# Patient Record
Sex: Male | Born: 1965 | Race: Black or African American | Hispanic: No | Marital: Single | Smoking: Current every day smoker
Health system: Southern US, Community
[De-identification: ages and names within clinical notes are randomized; demographics above are authoritative.]

## PROBLEM LIST (undated history)

## (undated) DIAGNOSIS — K859 Acute pancreatitis without necrosis or infection, unspecified: Secondary | ICD-10-CM

## (undated) HISTORY — PX: WRIST SURGERY: SHX841

---

## 1999-09-28 ENCOUNTER — Encounter: Payer: Self-pay | Admitting: Emergency Medicine

## 1999-09-28 ENCOUNTER — Emergency Department (HOSPITAL_COMMUNITY): Admission: EM | Admit: 1999-09-28 | Discharge: 1999-09-28 | Payer: Self-pay | Admitting: Emergency Medicine

## 2002-11-19 ENCOUNTER — Emergency Department (HOSPITAL_COMMUNITY): Admission: EM | Admit: 2002-11-19 | Discharge: 2002-11-20 | Payer: Self-pay | Admitting: Emergency Medicine

## 2002-11-19 ENCOUNTER — Encounter: Payer: Self-pay | Admitting: Emergency Medicine

## 2012-05-13 ENCOUNTER — Encounter (HOSPITAL_COMMUNITY): Payer: Self-pay | Admitting: *Deleted

## 2012-05-13 ENCOUNTER — Emergency Department (HOSPITAL_COMMUNITY): Payer: Self-pay

## 2012-05-13 ENCOUNTER — Inpatient Hospital Stay (HOSPITAL_COMMUNITY)
Admission: EM | Admit: 2012-05-13 | Discharge: 2012-05-18 | DRG: 440 | Disposition: A | Discharge status: Home / Self Care | Payer: MEDICAID | Attending: Family Medicine | Admitting: Family Medicine

## 2012-05-13 ENCOUNTER — Emergency Department (INDEPENDENT_AMBULATORY_CARE_PROVIDER_SITE_OTHER)
Admission: EM | Admit: 2012-05-13 | Discharge: 2012-05-13 | Disposition: A | Discharge status: Nursing Home (Includes ICF) | Payer: Self-pay | Source: Home / Self Care | Attending: Emergency Medicine | Admitting: Emergency Medicine

## 2012-05-13 DIAGNOSIS — Z833 Family history of diabetes mellitus: Secondary | ICD-10-CM

## 2012-05-13 DIAGNOSIS — G8929 Other chronic pain: Secondary | ICD-10-CM

## 2012-05-13 DIAGNOSIS — K8020 Calculus of gallbladder without cholecystitis without obstruction: Secondary | ICD-10-CM | POA: Diagnosis present

## 2012-05-13 DIAGNOSIS — R7309 Other abnormal glucose: Secondary | ICD-10-CM | POA: Diagnosis present

## 2012-05-13 DIAGNOSIS — R935 Abnormal findings on diagnostic imaging of other abdominal regions, including retroperitoneum: Secondary | ICD-10-CM | POA: Diagnosis present

## 2012-05-13 DIAGNOSIS — K859 Acute pancreatitis without necrosis or infection, unspecified: Principal | ICD-10-CM | POA: Diagnosis present

## 2012-05-13 DIAGNOSIS — Z720 Tobacco use: Secondary | ICD-10-CM | POA: Diagnosis present

## 2012-05-13 DIAGNOSIS — F17200 Nicotine dependence, unspecified, uncomplicated: Secondary | ICD-10-CM | POA: Diagnosis present

## 2012-05-13 DIAGNOSIS — R509 Fever, unspecified: Secondary | ICD-10-CM | POA: Diagnosis not present

## 2012-05-13 DIAGNOSIS — R030 Elevated blood-pressure reading, without diagnosis of hypertension: Secondary | ICD-10-CM | POA: Diagnosis present

## 2012-05-13 LAB — CBC
HCT: 47.7 % (ref 39.0–52.0)
Hemoglobin: 17.1 g/dL — ABNORMAL HIGH (ref 13.0–17.0)
MCH: 31.8 pg (ref 26.0–34.0)
MCHC: 35.8 g/dL (ref 30.0–36.0)
MCV: 88.8 fL (ref 78.0–100.0)
Platelets: 267 10*3/uL (ref 150–400)
RBC: 5.37 MIL/uL (ref 4.22–5.81)
RDW: 14.4 % (ref 11.5–15.5)
WBC: 12.6 10*3/uL — ABNORMAL HIGH (ref 4.0–10.5)

## 2012-05-13 LAB — COMPREHENSIVE METABOLIC PANEL
ALT: 81 U/L — ABNORMAL HIGH (ref 0–53)
AST: 63 U/L — ABNORMAL HIGH (ref 0–37)
Albumin: 4.5 g/dL (ref 3.5–5.2)
Alkaline Phosphatase: 82 U/L (ref 39–117)
BUN: 14 mg/dL (ref 6–23)
CO2: 26 mEq/L (ref 19–32)
Calcium: 9.9 mg/dL (ref 8.4–10.5)
Chloride: 102 mEq/L (ref 96–112)
Creatinine, Ser: 1.04 mg/dL (ref 0.50–1.35)
GFR calc Af Amer: >90 mL/min (ref >90)
GFR calc non Af Amer: 83 mL/min — ABNORMAL LOW (ref >90)
Glucose, Bld: 119 mg/dL — ABNORMAL HIGH (ref 70–99)
Potassium: 3.5 mEq/L (ref 3.5–5.1)
Sodium: 139 mEq/L (ref 135–145)
Total Bilirubin: 1.2 mg/dL (ref 0.3–1.2)
Total Protein: 8.2 g/dL (ref 6.0–8.3)

## 2012-05-13 LAB — LACTATE DEHYDROGENASE: LDH: 317 U/L — ABNORMAL HIGH (ref 94–250)

## 2012-05-13 LAB — LIPASE, BLOOD: Lipase: 1562 U/L — ABNORMAL HIGH (ref 11–59)

## 2012-05-13 LAB — MAGNESIUM: Magnesium: 2.4 mg/dL (ref 1.5–2.5)

## 2012-05-13 MED ORDER — MORPHINE SULFATE 4 MG/ML IJ SOLN
4.0000 mg | Freq: Once | INTRAMUSCULAR | Status: AC
  Administered 2012-05-13: 4 mg via INTRAVENOUS
  Filled 2012-05-13: qty 1

## 2012-05-13 MED ORDER — POTASSIUM CHLORIDE IN NACL 20-0.9 MEQ/L-% IV SOLN
INTRAVENOUS | Status: DC
  Administered 2012-05-14 – 2012-05-16 (×6): via INTRAVENOUS
  Filled 2012-05-13 (×10): qty 1000

## 2012-05-13 MED ORDER — DOCUSATE SODIUM 100 MG PO CAPS
100.0000 mg | ORAL_CAPSULE | Freq: Two times a day (BID) | ORAL | Status: DC
  Administered 2012-05-14 – 2012-05-18 (×9): 100 mg via ORAL
  Filled 2012-05-13 (×12): qty 1

## 2012-05-13 MED ORDER — ENOXAPARIN SODIUM 40 MG/0.4ML ~~LOC~~ SOLN
40.0000 mg | SUBCUTANEOUS | Status: DC
  Administered 2012-05-14 – 2012-05-17 (×5): 40 mg via SUBCUTANEOUS
  Filled 2012-05-13 (×7): qty 0.4

## 2012-05-13 MED ORDER — SODIUM CHLORIDE 0.9 % IV BOLUS (SEPSIS)
2000.0000 mL | Freq: Once | INTRAVENOUS | Status: AC
  Administered 2012-05-13: 2000 mL via INTRAVENOUS

## 2012-05-13 MED ORDER — ONDANSETRON HCL 4 MG/2ML IJ SOLN: 4.0000 mg | Freq: Four times a day (QID) | INTRAMUSCULAR | Status: DC | PRN

## 2012-05-13 MED ORDER — ONDANSETRON HCL 4 MG PO TABS: 4.0000 mg | ORAL_TABLET | Freq: Four times a day (QID) | ORAL | Status: DC | PRN

## 2012-05-13 MED ORDER — MORPHINE SULFATE 2 MG/ML IJ SOLN
2.0000 mg | INTRAMUSCULAR | Status: DC | PRN
  Administered 2012-05-14 (×2): 2 mg via INTRAVENOUS
  Filled 2012-05-13 (×2): qty 1

## 2012-05-13 MED ORDER — HYDROMORPHONE HCL PF 1 MG/ML IJ SOLN
1.0000 mg | Freq: Once | INTRAMUSCULAR | Status: AC
  Administered 2012-05-13: 1 mg via INTRAVENOUS
  Filled 2012-05-13: qty 1

## 2012-05-13 MED ORDER — ONDANSETRON HCL 4 MG/2ML IJ SOLN
4.0000 mg | Freq: Once | INTRAMUSCULAR | Status: AC
  Administered 2012-05-13: 4 mg via INTRAVENOUS
  Filled 2012-05-13 (×2): qty 2

## 2012-05-13 MED ORDER — SODIUM CHLORIDE 0.9 % IV BOLUS (SEPSIS)
1000.0000 mL | Freq: Once | INTRAVENOUS | Status: AC
  Administered 2012-05-13: 1000 mL via INTRAVENOUS

## 2012-05-13 NOTE — ED Provider Notes (Signed)
History     CSN: [id number]  Arrival date & time 05/13/12  1520   First MD Initiated Contact with Patient 05/13/12 1523      Chief Complaint  Patient presents with  . Abdominal Pain    (Consider location/radiation/quality/duration/timing/severity/associated sxs/prior treatment) Patient is a 46 y.o. male presenting with abdominal pain.  Abdominal Pain The primary symptoms of the illness include abdominal pain, nausea and vomiting. The primary symptoms of the illness do not include fever, fatigue, diarrhea, hematemesis, hematochezia or dysuria. The current episode started yesterday. The onset of the illness was sudden. The problem has been rapidly worsening.  The patient has not had a change in bowel habit. Additional symptoms associated with the illness include back pain. Symptoms associated with the illness do not include diaphoresis, constipation, urgency, hematuria or frequency. Significant associated medical issues include substance abuse. Significant associated medical issues do not include diabetes, gallstones, liver disease or cardiac disease.  Patient denies taking any medications for this issue.  Patient also reports that, beginning today, his tongue has turned black.  He is not certain why and has not noticed it before today.   History reviewed. No pertinent past medical history.  Past Surgical History  Procedure Date  . Wrist surgery     Family History  Problem Relation Age of Onset  . Diabetes Mother     History  Substance Use Topics  . Smoking status: Current Every Day Smoker    Types: Cigarettes  . Smokeless tobacco: Not on file  . Alcohol Use: No      Review of Systems  Constitutional: Positive for appetite change. Negative for fever, diaphoresis and fatigue.  Eyes: Negative for redness and itching.  Cardiovascular: Negative.   Gastrointestinal: Positive for nausea, vomiting and abdominal pain. Negative for diarrhea, constipation, hematochezia and  hematemesis.  Genitourinary: Negative for dysuria, urgency, frequency and hematuria.  Musculoskeletal: Positive for back pain.  Skin: Negative for color change.    Allergies  Review of patient's allergies indicates no known allergies.  Home Medications  No current outpatient prescriptions on file.  BP 153/91  Pulse 80  Temp 99.5 F (37.5 C) (Oral)  Resp 19  SpO2 98%  Physical Exam  Constitutional: He is oriented to person, place, and time. He appears well-developed and well-nourished. No distress.  HENT:  Head: Normocephalic and atraumatic.       Black discoloration on tongue  Eyes: Conjunctivae normal are normal. Pupils are equal, round, and reactive to light.  Neck: Normal range of motion. Neck supple.  Cardiovascular: Normal rate, regular rhythm and normal heart sounds.   Pulmonary/Chest: Effort normal and breath sounds normal.  Abdominal: Soft. He exhibits no distension and no mass. There is tenderness. There is no rebound.       Diffuse abdominal pain, worse with palpation.  Pain is worst in the RUQ where there is slight guarding.  Equivocal Murphy's sign.  Neurological: He is alert and oriented to person, place, and time.  Skin: Skin is warm and dry.    ED Course  Procedures (including critical care time)  Labs Reviewed - No data to display No results found.   No diagnosis found.    MDM  Findings concerning for abdominal pathology.  Feel patient most likely requires further eval and imaging of gall bladder and pancreas.  Will transfer to ED for further workup.        Brent Bulla, MD 05/13/12 [phone]

## 2012-05-13 NOTE — ED Provider Notes (Signed)
Medical screening examination/treatment/procedure(s) were performed by non-physician practitioner and as supervising physician I was immediately available for consultation/collaboration.  Catharina Pica   Myrakle Wingler, MD 05/13/12 1724 

## 2012-05-13 NOTE — ED Notes (Signed)
Patient transported to US 

## 2012-05-13 NOTE — ED Notes (Signed)
Patient with abdominal pain and nausea vomiting since yesterday.  Patient denies diarrhea. Pain is generalized and hurts more when he lays down

## 2012-05-13 NOTE — ED Provider Notes (Signed)
Medical screening examination/treatment/procedure(s) were conducted as a shared visit with non-physician practitioner(s) and myself.  I personally evaluated the patient during the encounter  Patient seen by me first episode of pancreatitis, frequent vomiting will need admisison. No evidence of acute choleycystits. Suspect gallstone pancreatitis.     Shelda Jakes, MD 05/13/12 2147

## 2012-05-13 NOTE — H&P (Signed)
Family Medicine Teaching [facility] Admission History and Physical Service Pager: [phone]  Patient name: Alfred Gray Medical record number: [id number] Date of birth: 11-25-1965 Age: 46 y.o. Gender: male  Primary Care Provider: Sheila Oats, MD  Chief Complaint: abdominal pain  Assessment and Plan: [name] is a 46 y.o. year old male presenting with abdominal pain for approximately 48 hours with intermittent nausea and vomiting. Lipase 1562, LDH 317. WBC mildly elevated at 12.6. Abdominal US shows multiple gallstones but no evidence for cholelithiasis. S/p 3L NS boluses in the ED, pain/nausea improved with Dilaudid, morphine, and Zofran. Etiology most likely pancreatitis, Ranson score 0 (<5% risk of mortality).  1. Acute pancreatitis - Will admit to inpt and keep pt NPO. Morphine 2 mg IV q2h, Zofran PRN for nausea. IVF, NS+20 mEq KCl at 100 mL/h. Will recheck CMP, lipase, CBC, fasting lipid panel in the AM.   2. Family history of DM with mildly elevated glucose in the ED, so will check A1c in the AM as well.  FEN/GI: NPO except for ice chips; advance diet slowly as tolerated Prophylaxis: Lovenox Disposition: Management as above, likely discharge home pending improvement of symptoms and ability to tolerate PO intake Code Status: Full  History of Present Illness: Alfred Gray is a 46 y.o. year old male presenting with abdominal pain. Pt states pain started in central abdomen but was diffuse all over abdomen and through to back. Started yesterday morning and pt initially thought was hunger pain and ate, but then vomited food back up; pt states "I ate some pizza and I got bloated and the pain got really bad, and I had to make myself throw up the first time but after that it kept coming by itself." Pain intensified throughout the day, and intermittent nausea/vomiting continued throughout the evening/night and today. Pain was worse this morning up to 10/10, prompting pt to come to  the ED. Pt did not try any medication at home, states "I was too scared to put anything on my stomach." Some nausea/vomiting after arriving in the hospital; basin at bedside with some yellowish fluid emesis present. Endorses current epigastric pain, 4-5/10, improved after pain medication in the ED. Nausea improved after Zofran in the ED.  Denies recent alcohol use. No BM since pain started. N/V off and on. Able to keep down some water this morning. No fevers or sick contacts. Some similar pain about a month ago, but not this bad. Pt did note black discoloration on tongue today but had never noticed it before.  Pt denies other medical problems and does not see a doctor regularly. Does not take any regular medications.  There is no problem list on file for this patient.  Past Medical History: History reviewed. No pertinent past medical history. Past Surgical History: Past Surgical History  Procedure Date  . Wrist surgery   . Wrist surgery    Social History: History  Substance Use Topics  . Smoking status: Current Every Day Smoker -- 0.5 packs/day    Types: Cigarettes  . Smokeless tobacco: Not on file  . Alcohol Use: No   For any additional social history documentation, please refer to relevant sections of EMR.  Family History: Family History  Problem Relation Age of Onset  . Diabetes Mother    Allergies: No Known Allergies No current facility-administered medications on file prior to encounter.   No current outpatient prescriptions on file prior to encounter.   Review Of Systems: Per HPI.  Physical Exam: BP 146/71  Pulse  84  Temp 98.7 F (37.1 C) (Oral)  Resp 18  SpO2 98% Exam: General: adult male in NAD, appropriate and cooperative with exam HEENT: [place]/AT, EOMI, mucous membranes slightly dry; black discoloration of tongue  Cardiovascular: normal S1, S2, RRR, no murmur Respiratory: CTAB, normal work of breathing Abdomen: soft, minimally distended, diffusely tender;  most tender in epigastric region (pt received Dilaudid 1 mg IV <1.5 hours prior to exam) Extremities: moves all extremities equally, distal pulses 2+ and symmetric Skin: warm, dry, intact Neuro: no gross focal deficits, strength 5/5 in all four extremities  Labs and Imaging:  Lab 05/13/12 1708  WBC 12.6*  HGB 17.1*  HCT 47.7  PLT 267    Lab 05/13/12 1708  NA 139  K 3.5  CL 102  CO2 26  BUN 14  CREATININE 1.04  CALCIUM 9.9  PROT 8.2  BILITOT 1.2  ALKPHOS 82  ALT 81*  AST 63*  GLUCOSE 119*  Lipase (10/3 @1825 ): 1562 LDH (10/3 @2026 ): 317  US abdomen 10/3 @1952 : IMPRESSION:  1. Cholelithiasis. Numerous gallstones fill the gallbladder  lumen.  2. The gallbladder wall thickness is normal.  3. Question very mild micronodular contour the left lobe of the  liver. Early / mild hepatic cirrhosis cannot be excluded.  Bobbye Morton, MD PGY-1, [facility] Family Medicine FPTS Intern pager: [phone]   I interviewed and examined the patient with Dr. Casper Harrison. I have read his note and made appropriate amendments. I agree with his assessment and plan.   Si Raider Clinton Sawyer, MD, PGY-2

## 2012-05-13 NOTE — ED Provider Notes (Signed)
History     CSN: [id number]  Arrival date & time 05/13/12  1700   First MD Initiated Contact with Patient 05/13/12 1814      Chief Complaint  Patient presents with  . Abdominal Pain  . Nausea    (Consider location/radiation/quality/duration/timing/severity/associated sxs/prior treatment) HPI  46 y.o. male INAD c/o 84/10 epigastric/RUQ pain x1 day. Multiple episodes of vomiting yesterday, now tolerating by mouth liquids. Denies fever, diarrhea, denies dark stool, NSAID use. Patient also recently noticed that his tongue is black.  History reviewed. No pertinent past medical history.  Past Surgical History  Procedure Date  . Wrist surgery   . Wrist surgery     Family History  Problem Relation Age of Onset  . Diabetes Mother     History  Substance Use Topics  . Smoking status: Current Every Day Smoker -- 0.5 packs/day    Types: Cigarettes  . Smokeless tobacco: Not on file  . Alcohol Use: No      Review of Systems  Constitutional: Negative for fever.  Respiratory: Negative for shortness of breath.   Cardiovascular: Negative for chest pain.  Gastrointestinal: Positive for nausea, vomiting and abdominal pain. Negative for diarrhea.  All other systems reviewed and are negative.    Allergies  Review of patient's allergies indicates no known allergies.  Home Medications  No current outpatient prescriptions on file.  BP 147/87  Pulse 77  Temp 98.4 F (36.9 C)  Resp 16  SpO2 98%  Physical Exam  Nursing note and vitals reviewed. Constitutional: He is oriented to person, place, and time. He appears well-developed and well-nourished.       Appears uncomfortable  HENT:  Head: Normocephalic.  Mouth/Throat: Oropharynx is clear and moist.       Tongue is black.  Eyes: Conjunctivae normal and EOM are normal. Pupils are equal, round, and reactive to light.  Cardiovascular: Normal rate, regular rhythm and intact distal pulses.   Pulmonary/Chest: Effort normal and  breath sounds normal. No stridor. No respiratory distress. He has no wheezes. He has no rales. He exhibits no tenderness.  Abdominal: Soft. Bowel sounds are normal. He exhibits no distension and no mass. There is no rebound and no guarding.       Tender to palpation of the epigastrium and right upper quadrant.  Musculoskeletal: Normal range of motion.  Neurological: He is alert and oriented to person, place, and time.  Skin: Skin is warm.  Psychiatric: He has a normal mood and affect.    ED Course  Procedures (including critical care time)  Labs Reviewed  CBC - Abnormal; Notable for the following:    WBC 12.6 (*)     Hemoglobin 17.1 (*)     All other components within normal limits  COMPREHENSIVE METABOLIC PANEL - Abnormal; Notable for the following:    Glucose, Bld 119 (*)     AST 63 (*)     ALT 81 (*)     GFR calc non Af Amer 83 (*)     All other components within normal limits  LIPASE, BLOOD - Abnormal; Notable for the following:    Lipase 1562 (*)     All other components within normal limits  LACTATE DEHYDROGENASE  MAGNESIUM   US Abdomen Complete  05/13/2012  *RADIOLOGY REPORT*  Clinical Data:  Right upper quadrant pain, nausea, and vomiting.  ABDOMINAL ULTRASOUND COMPLETE  Comparison:  None.  Findings:  Gallbladder:  A wall echo shadow complex is seen within the gallbladder, indicating  numerous gallstones filling the gallbladder lumen.  Gallbladder wall thickness is normal (1.9 mm).  The sonographic Murphy's sign is negative.  Common Bile Duct:  Within normal limits in caliber. Measures 3.8 mm where visualized.  Liver: 8 x 5 x 7 mm cyst is seen in the left lobe of the liver. Within normal limits in parenchymal echogenicity. On images number 39 and 40 of the left lobe of the liver, there is suggestion of slight micronodularity of the liver contour.  Early hepatic cirrhosis cannot be excluded.  There are no prior imaging studies of the liver for comparison.  The portal vein is  patent and demonstrates normal direction of flow.  IVC:  Appears normal.  Pancreas:  Although the pancreas is difficult to visualize in its entirety, no focal pancreatic abnormality is identified. Distal pancreas is obscured by bowel gas.  Spleen:  Within normal limits in size and echotexture.  Right kidney:  Normal in size and parenchymal echogenicity.  No evidence of mass or hydronephrosis.  Left kidney:  Normal in size and parenchymal echogenicity.  No evidence of mass or hydronephrosis.  Abdominal Aorta:  No aneurysm identified.  No ascites is identified.  IMPRESSION: 1.  Cholelithiasis.  Numerous gallstones fill the gallbladder lumen. 2.  The gallbladder wall thickness is normal. 3.  Question very mild micronodular contour the left lobe of the liver.  Early / mild hepatic cirrhosis cannot be excluded.   Original Report Authenticated By: Britta Mccreedy, M.D.      1. Pancreatitis       MDM  Patient with one day of epigastric abdominal pain and multiple episodes of nausea vomiting yesterday patient is tolerating by mouth today. Physical exam shows tender epigastrium with no peritoneal signs. I will hydrate the patient control pain and nausea.  Lipase is significantly elevated at 1562, glucose is mildly elevated at 119 and AST and ALT are 63 and 81 respectively. White blood cell count is 12.6. Abdominal ultrasound shows many gallstones with no gallbladder wall thickening.  8:45 PM patient is in 6/10 pain. Nausea is controlled , Discussed results with patient and plan for admission. Patient states he is not a drinker and has alcohol twice a year.  Likely gallstone pancreatitis.  9:14 PM Consult from internal medicine teaching service Dr. Adriana Simas appreciated: Patient will be admitted to their service.        Wynetta Emery, PA-C 05/14/12 1601

## 2012-05-13 NOTE — ED Notes (Signed)
Pt reports abdomen cramping with episodes of vomiting yesterday - denies vomiting dark matter. Denies fever. Pt tongue is black with no obvious explanation per pt

## 2012-05-14 ENCOUNTER — Encounter (HOSPITAL_COMMUNITY): Payer: Self-pay | Admitting: Family Medicine

## 2012-05-14 LAB — CBC
HCT: 41.2 % (ref 39.0–52.0)
HCT: 44 % (ref 39.0–52.0)
Hemoglobin: 14.4 g/dL (ref 13.0–17.0)
Hemoglobin: 15.2 g/dL (ref 13.0–17.0)
MCH: 31 pg (ref 26.0–34.0)
MCH: 31.2 pg (ref 26.0–34.0)
MCHC: 34.5 g/dL (ref 30.0–36.0)
MCHC: 35 g/dL (ref 30.0–36.0)
MCV: 89.2 fL (ref 78.0–100.0)
MCV: 89.6 fL (ref 78.0–100.0)
Platelets: 230 10*3/uL (ref 150–400)
Platelets: 231 10*3/uL (ref 150–400)
RBC: 4.62 MIL/uL (ref 4.22–5.81)
RBC: 4.91 MIL/uL (ref 4.22–5.81)
RDW: 14.2 % (ref 11.5–15.5)
RDW: 14.5 % (ref 11.5–15.5)
WBC: 12.8 10*3/uL — ABNORMAL HIGH (ref 4.0–10.5)
WBC: 13.1 10*3/uL — ABNORMAL HIGH (ref 4.0–10.5)

## 2012-05-14 LAB — CREATININE, SERUM
Creatinine, Ser: 1.06 mg/dL (ref 0.50–1.35)
GFR calc Af Amer: >90 mL/min (ref >90)
GFR calc non Af Amer: 81 mL/min — ABNORMAL LOW (ref >90)

## 2012-05-14 LAB — COMPREHENSIVE METABOLIC PANEL
ALT: 46 U/L (ref 0–53)
AST: 31 U/L (ref 0–37)
Albumin: 3.5 g/dL (ref 3.5–5.2)
Alkaline Phosphatase: 62 U/L (ref 39–117)
Alkaline Phosphatase: 65 U/L (ref 39–117)
BUN: 12 mg/dL (ref 6–23)
BUN: 13 mg/dL (ref 6–23)
CO2: 25 mEq/L (ref 19–32)
Calcium: 8.7 mg/dL (ref 8.4–10.5)
Chloride: 106 mEq/L (ref 96–112)
Chloride: 103 mEq/L (ref 96–112)
Creatinine, Ser: 0.93 mg/dL (ref 0.50–1.35)
Creatinine, Ser: 0.94 mg/dL (ref 0.50–1.35)
GFR calc Af Amer: >90 mL/min (ref >90)
GFR calc Af Amer: >90 mL/min (ref >90)
GFR calc non Af Amer: >90 mL/min (ref >90)
GFR calc non Af Amer: >90 mL/min (ref >90)
Glucose, Bld: 98 mg/dL (ref 70–99)
Glucose, Bld: 91 mg/dL (ref 70–99)
Potassium: 3.5 mEq/L (ref 3.5–5.1)
Potassium: 4.6 mEq/L (ref 3.5–5.1)
Sodium: 139 mEq/L (ref 135–145)
Total Bilirubin: 1.2 mg/dL (ref 0.3–1.2)
Total Bilirubin: 1.3 mg/dL — ABNORMAL HIGH (ref 0.3–1.2)
Total Protein: 6.6 g/dL (ref 6.0–8.3)

## 2012-05-14 LAB — LIPID PANEL
Cholesterol: 157 mg/dL (ref 0–200)
HDL: 53 mg/dL (ref >39)
LDL Cholesterol: 86 mg/dL (ref 0–99)
Total CHOL/HDL Ratio: 3 RATIO
Triglycerides: 90 mg/dL (ref <150)
VLDL: 18 mg/dL (ref 0–40)

## 2012-05-14 LAB — HEMOGLOBIN A1C
Hgb A1c MFr Bld: 5.8 % — ABNORMAL HIGH (ref <5.7)
Mean Plasma Glucose: 120 mg/dL — ABNORMAL HIGH (ref <117)

## 2012-05-14 LAB — LIPASE, BLOOD: Lipase: 521 U/L — ABNORMAL HIGH (ref 11–59)

## 2012-05-14 MED ORDER — MORPHINE SULFATE 2 MG/ML IJ SOLN
2.0000 mg | INTRAMUSCULAR | Status: DC | PRN
  Administered 2012-05-14 – 2012-05-16 (×8): 2 mg via INTRAVENOUS
  Filled 2012-05-14 (×8): qty 1

## 2012-05-14 MED ORDER — WHITE PETROLATUM GEL
Status: AC
  Administered 2012-05-14: 23:00:00
  Filled 2012-05-14: qty 5

## 2012-05-14 MED ORDER — ACETAMINOPHEN 325 MG PO TABS
650.0000 mg | ORAL_TABLET | Freq: Four times a day (QID) | ORAL | Status: DC | PRN
  Administered 2012-05-14: 650 mg via ORAL
  Filled 2012-05-14: qty 2

## 2012-05-14 MED ORDER — PIPERACILLIN-TAZOBACTAM 3.375 G IVPB
3.3750 g | Freq: Three times a day (TID) | INTRAVENOUS | Status: DC
  Administered 2012-05-14 – 2012-05-16 (×5): 3.375 g via INTRAVENOUS
  Filled 2012-05-14 (×7): qty 50

## 2012-05-14 NOTE — Progress Notes (Signed)
Family Medicine Teaching Service Daily Progress Note Intern Pager: [phone]  Patient name: Alfred Gray Medical record number: [id number] Date of birth: 08-Jun-1966 Age: 46 y.o. Gender: male  Primary Care Provider: DEFAULT,PROVIDER, MD  Subjective: Pt seen at bedside, states last night "went so-so." Was able to sleep a bit. Morphine around 0530 did not help pain much, but overall pain is slightly better. Able to drink a few sips of water without any further nausea or emesis. No new shortness of breath or chest pain, no pain in lower extremities.  Objective: Temp:  [98.1 F (36.7 C)-100 F (37.8 C)] 100 F (37.8 C) (10/04 0517) Pulse Rate:  [77-90] 87  (10/04 0517) Resp:  [16-19] 17  (10/04 0517) BP: (143-154)/(71-99) 154/99 mmHg (10/04 0517) SpO2:  [95 %-98 %] 96 % (10/04 0517) Weight:  [189 lb (85.73 kg)] 189 lb (85.73 kg) (10/03 2330) Exam: General: adult male lying in bed, in NAD, alert and cooperative Cardiovascular: normal S1, S2, RRR Respiratory: CTAB, no increased work of breathing Abdomen: faint bowel sounds, mild distention; slightly firm from muscle tightening in anticipation of pain; tender to palpation in upper abdomen, worst in epigastric area Extremities: moves all extremities equally, distal pulses 2+ and symmetric, no calf tenderness or LE edema  Laboratory:  Lab 05/14/12 0610 05/13/12 2358 05/13/12 1708  WBC 13.1* 12.8* 12.6*  HGB 14.4 15.2 17.1*  HCT 41.2 44.0 47.7  PLT 230 231 267    Lab 05/14/12 0610 05/13/12 2358 05/13/12 1708  NA 139 -- 139  K 3.5 -- 3.5  CL 106 -- 102  CO2 25 -- 26  BUN 12 -- 14  CREATININE 0.93 1.06 1.04  CALCIUM 8.7 -- 9.9  PROT 6.6 -- 8.2  BILITOT 1.2 -- 1.2  ALKPHOS 62 -- 82  ALT 46 -- 81*  AST 31 -- 63*  GLUCOSE 98 -- 119*  Lipase  10/3 @1825 : 1562  10/4 @0610 : 521  Imaging/Diagnostic Tests: Abdominal US, 10/3 @1952  IMPRESSION:  1. Cholelithiasis. Numerous gallstones fill the gallbladder  lumen.  2. The gallbladder  wall thickness is normal.  3. Question very mild micronodular contour the left lobe of the  liver. Early / mild hepatic cirrhosis cannot be excluded.   Assessment/Plan: Alfred Gray is a 46 y.o. year old male presenting with abdominal pain for approximately 48 hours with intermittent nausea and vomiting. Abdominal US shows multiple gallstones but no evidence for cholelithiasis. S/p 3L NS boluses in the ED, pain/nausea improved with Dilaudid, morphine, and Zofran. Etiology most likely pancreatitis, Ranson score 0 (<5% risk of mortality). Doing well this morning, still with some abdominal pain. Able to drink a few sips of water without nausea/vomiting or increased discomfort.  1. Acute pancreatitis - On admission, lipase 1562, LDH 317, WBC mildly elevated at 12.6. Lipase this morning improving at 521, WBC still mildly elevated at 13.1. Pt afebrile but one temp overnight at 100 F. Plan - Continue NPO for now. Will increase morphine to 4 mg IV q3h. Zofran PRN for nausea. IVF, NS+20 mEq KCl at 100 mL/h. Given possible involvement of gallstones, may consider referral to surgery for elective cholecystectomy after resolution of acute illness.  2. Elevated BP since admission, 140's-150's/70's-90's. Possible component of pain, but may require antihypertensive medications while inpt vs starting at follow-up.  3. Family history of DM with mildly elevated glucose in the ED. Glucose not elevated this morning, at 98. A1c pending.  FEN/GI: NPO except for ice chips; advance diet slowly as tolerated  Prophylaxis:  Lovenox  Disposition: Management as above, likely discharge home pending improvement of symptoms and ability to tolerate PO intake. Pt would like to follow up with [facility]. Code Status: Full  Bobbye Morton, MD PGY-1, [facility] Family Medicine FPTS Intern pager: [phone]

## 2012-05-14 NOTE — Progress Notes (Signed)
Pt.'s T: 101.6,MD. Was notified,orders for tylenol were request by RN.Keep monitoring pt. closely

## 2012-05-14 NOTE — Progress Notes (Signed)
FMTS Attending Daily Note:  Jeff Kveon Casanas MD  [phone] pager  Family Practice pager:  [phone]  I have reviewed this patient and the chart. I have discussed this patient with the resident and admitting attending Dr. Hale.  I agree with their findings, assessment and care plan.  

## 2012-05-14 NOTE — Progress Notes (Signed)
Received call stating pt's temp was 101.6.  Went to examine pt and he is stating he is in more pain.    Exam does show increased RUQ pain with minimal guarding , negative Murphy's sign, negative scleral icterus.  AAOx3  A/P Concern for ascending cholangitis. Will get repeat CMP and if LFT are elevated from previous, will get repeat RUQ [place].  Twana First Paulina Fusi, DO of [facility] 05/14/2012, 6:13 PM

## 2012-05-14 NOTE — H&P (Signed)
I interviewed and examined this patient and discussed the care plan with Dr. Clinton Sawyer and the [facility] team and agree with assessment and plan as documented in the admission note for yesterday. His abdomen is very quiet and mildly distended. No BM for 2 days. We can anticipate a period of ileus. Gallstone passage is the likely cause, but we should also review triglycerides.    Royalti Schauf A. Sheffield Slider, MD Family Medicine Teaching Service Attending  05/14/2012 7:27 AM

## 2012-05-15 LAB — COMPREHENSIVE METABOLIC PANEL
ALT: 28 U/L (ref 0–53)
AST: 18 U/L (ref 0–37)
Albumin: 3.1 g/dL — ABNORMAL LOW (ref 3.5–5.2)
Calcium: 8.8 mg/dL (ref 8.4–10.5)
GFR calc Af Amer: >90 mL/min (ref >90)
Sodium: 136 mEq/L (ref 135–145)
Total Protein: 6.6 g/dL (ref 6.0–8.3)

## 2012-05-15 LAB — CBC
Hemoglobin: 14 g/dL (ref 13.0–17.0)
Platelets: 120 10*3/uL — ABNORMAL LOW (ref 150–400)
RDW: 14.2 % (ref 11.5–15.5)

## 2012-05-15 NOTE — Progress Notes (Signed)
Family Medicine Teaching Service Daily Progress Note Intern Pager: [phone]  Patient name: Shamell Suarez Medical record number: [id number] Date of birth: 1966-06-14 Age: 46 y.o. Gender: male  Primary Care Provider: DEFAULT,PROVIDER, MD  Subjective: Pt seen at bedside. States he feels somewhat better. Still with epigastric/RUQ pain 4-6/10 but improved, doing well with current pain meds. No further N/V. Still has not had a BM but has not eaten since admission and ate very little from Wednesday to time of admission due to vomiting. Overall feels well enough to try eating some, today.  Objective: Temp:  [98 F (36.7 C)-101.6 F (38.7 C)] 98.9 F (37.2 C) (10/05 0839) Pulse Rate:  [80-87] 85  (10/05 0839) Resp:  [16-18] 18  (10/05 0839) BP: (130-154)/(71-95) 154/95 mmHg (10/05 0839) SpO2:  [94 %-97 %] 96 % (10/05 0839) Weight:  [190 lb 4.1 oz (86.3 kg)] 190 lb 4.1 oz (86.3 kg) (10/04 2053) Exam: General: adult male lying in bed, in NAD, alert and cooperative Cardiovascular: normal S1, S2, RRR Respiratory: CTAB, no increased work of breathing Abdomen: faint bowel sounds, still slightly tender mostly in RUQ and epigastrium, improved from previous days Extremities: moves all extremities equally, distal pulses 2+ and symmetric  Laboratory:  Lab 05/14/12 0610 05/13/12 2358 05/13/12 1708  WBC 13.1* 12.8* 12.6*  HGB 14.4 15.2 17.1*  HCT 41.2 44.0 47.7  PLT 230 231 267    Lab 05/14/12 1815 05/14/12 0610 05/13/12 2358 05/13/12 1708  NA 137 139 -- 139  K 4.6 3.5 -- 3.5  CL 103 106 -- 102  CO2 25 25 -- 26  BUN 13 12 -- 14  CREATININE 0.94 0.93 1.06 --  CALCIUM 9.0 8.7 -- 9.9  PROT 6.8 6.6 -- 8.2  BILITOT 1.3* 1.2 -- 1.2  ALKPHOS 65 62 -- 82  ALT 39 46 -- 81*  AST 24 31 -- 63*  GLUCOSE 91 98 -- 119*  Lipase  10/3 @1825 : 1562  10/4 @0610 : 521  Imaging/Diagnostic Tests: Abdominal US, 10/3 @1952  IMPRESSION:  1. Cholelithiasis. Numerous gallstones fill the gallbladder  lumen.  2.  The gallbladder wall thickness is normal.  3. Question very mild micronodular contour the left lobe of the  liver. Early / mild hepatic cirrhosis cannot be excluded.   Assessment/Plan: Orlie Cundari is a 46 y.o. year old male presenting with abdominal pain for approximately 48 hours with intermittent nausea and vomiting. Abdominal US shows multiple gallstones but no evidence for cholelithiasis. S/p 3L NS boluses in the ED, pain/nausea improved with Dilaudid, morphine, and Zofran. Etiology most likely pancreatitis, Ranson score 0 (<5% risk of mortality). Doing well this morning, still with some abdominal pain. Able to drink a few sips of water without nausea/vomiting or increased discomfort.  1. Acute pancreatitis - On admission, lipase 1562, LDH 317, WBC mildly elevated at 12.6. Lipase this morning improving at 521, WBC still mildly elevated at 13.1. Pt afebrile but one temp overnight at 100 F. Plan - Continue morphine 2 mg IV q3h. Zofran PRN for nausea. Decrease IVF, NS+20 mEq KCl to 75 mL/h. Likely involvement of gallstones, so will likely refer to surgery for elective cholecystectomy after resolution of acute illness, probably on an outpt basis.  2. Elevated BP since admission, 140's-150's/70's-90's. Possible component of pain, but may require antihypertensive medications while inpt vs starting at follow-up.  3. Family history of DM with mildly elevated glucose in the ED. Glucose not elevated this morning, at 98. A1c 5.8.  FEN/GI: NPO except for ice chips;  advance diet slowly as tolerated  Prophylaxis: Lovenox  Disposition: Management as above, likely discharge home pending improvement of symptoms and ability to tolerate PO intake. Pt would like to follow up with [facility]. Pt states he will need a work note for Wednesday through time of discharge. Code Status: Full  Bobbye Morton, MD PGY-1, [facility] Family Medicine FPTS Intern pager: [phone]

## 2012-05-15 NOTE — Progress Notes (Signed)
FMTS Attending Daily Note:  Renold Don MD  [phone] pager  Family Practice pager:  [phone] I have seen and examined this patient and have reviewed their chart. I have discussed this patient with the resident. I agree with the resident's findings, assessment and care plan.  Advancing to liquid diet today.  Still with some mild pain but much improved from admission.  Following fever curve.

## 2012-05-16 DIAGNOSIS — R509 Fever, unspecified: Secondary | ICD-10-CM | POA: Diagnosis not present

## 2012-05-16 DIAGNOSIS — Z833 Family history of diabetes mellitus: Secondary | ICD-10-CM

## 2012-05-16 DIAGNOSIS — K859 Acute pancreatitis without necrosis or infection, unspecified: Principal | ICD-10-CM | POA: Diagnosis present

## 2012-05-16 DIAGNOSIS — Z720 Tobacco use: Secondary | ICD-10-CM | POA: Diagnosis present

## 2012-05-16 MED ORDER — AMOXICILLIN-POT CLAVULANATE 500-125 MG PO TABS
1.0000 | ORAL_TABLET | Freq: Three times a day (TID) | ORAL | Status: DC
  Administered 2012-05-16 – 2012-05-17 (×3): 500 mg via ORAL
  Filled 2012-05-16 (×5): qty 1

## 2012-05-16 MED ORDER — OXYCODONE-ACETAMINOPHEN 5-325 MG PO TABS
1.0000 | ORAL_TABLET | ORAL | Status: DC | PRN
  Administered 2012-05-16 – 2012-05-18 (×6): 1 via ORAL
  Filled 2012-05-16 (×6): qty 1

## 2012-05-16 NOTE — Progress Notes (Addendum)
Family Medicine Teaching Service Daily Progress Note Intern Pager: [phone]  Patient name: Alfred Gray Medical record number: [id number] Date of birth: January 23, 1966 Age: 46 y.o. Gender: male  Primary Care Provider: DEFAULT,PROVIDER, MD  Subjective: Pt seen at bedside. IV infiltrated this morning, but tolerating PO well. Pain is much better. No further nausea/vomiting. Pt states he feels pretty well and thinks he may feel comfortable going home later today but would also be okay with waiting until tomorrow morning.  Objective: Temp:  [98 F (36.7 C)-100.5 F (38.1 C)] 98 F (36.7 C) (10/06 0918) Pulse Rate:  [55-85] 81  (10/06 0918) Resp:  [16-18] 16  (10/06 0918) BP: (151-155)/(83-101) 152/90 mmHg (10/06 0918) SpO2:  [97 %-100 %] 100 % (10/06 0918) Weight:  [188 lb 7.9 oz (85.5 kg)] 188 lb 7.9 oz (85.5 kg) (10/05 2014) Exam: General: adult male sitting up in chair, in NAD, alert and cooperative Cardiovascular: normal S1, S2, RRR Respiratory: CTAB, no increased work of breathing Abdomen: bowel sounds faint, pain much improved; minimally tender  Extremities: moves all extremities equally, distal pulses 2+ and symmetric  Laboratory:  Lab 05/15/12 0840 05/14/12 0610 05/13/12 2358  WBC 12.6* 13.1* 12.8*  HGB 14.0 14.4 15.2  HCT 40.0 41.2 44.0  PLT 120* 230 231    Lab 05/15/12 0840 05/14/12 1815 05/14/12 0610  NA 136 137 139  K 3.9 4.6 3.5  CL 105 103 106  CO2 22 25 25   BUN 12 13 12   CREATININE 0.86 0.94 0.93  CALCIUM 8.8 9.0 8.7  PROT 6.6 6.8 6.6  BILITOT 1.4* 1.3* 1.2  ALKPHOS 58 65 62  ALT 28 39 46  AST 18 24 31   GLUCOSE 83 91 98  Lipase  10/3 @1825 : 1562  10/4 @0610 : 521  Imaging/Diagnostic Tests: Abdominal US, 10/3 @1952  IMPRESSION:  1. Cholelithiasis. Numerous gallstones fill the gallbladder  lumen.  2. The gallbladder wall thickness is normal.  3. Question very mild micronodular contour the left lobe of the  liver. Early / mild hepatic cirrhosis cannot be  excluded.  Assessment/Plan: Alfred Gray is a 46 y.o. year old male presenting with abdominal pain for approximately 48 hours with intermittent nausea and vomiting. Abdominal US shows multiple gallstones but no evidence for cholelithiasis. S/p 3L NS boluses in the ED, pain/nausea improved with Dilaudid, morphine, and Zofran. Etiology most likely pancreatitis, Ranson score 0 (<5% risk of mortality). Pain is much improved. Tolerating PO well. IV infiltrated this morning, meds transitioned to PO.  1. Acute pancreatitis - On admission, lipase 1562, LDH 317, WBC mildly elevated at 12.6 on 10/5 with lipase improved to 521 on 10/4. Pt currently afebrile but temp to 100.5 yesterday 10/5 at 5 PM, 100 at 8 PM. Afebrile since then. Plan - Percocet 5-325 q4 for pain. Zosyn discontinued, switched to Augmentin; will treat for total 7 days of abx (last day of dosing 10/10). Zofran PRN for nausea. IVF discontinued. Likely involvement of gallstones, so will likely refer to surgery for elective cholecystectomy after resolution of acute illness, probably on an outpt basis.  2. Elevated BP since admission, 140's-150's/70's-90's. Possible component of pain, but may require antihypertensive medications while inpt vs starting at follow-up.  3. Family history of DM with mildly elevated glucose in the ED. A1c 5.8, so no need for any glucose management, currently.  4. Current smoker. Will need smoking cessation counseling as outpt.  FEN/GI: Alfred Gray diet; advance diet slowly as tolerated  Prophylaxis: Lovenox  Disposition: Management as above, likely discharge  home pending improvement of symptoms and ability to tolerate PO intake. Pt would like to follow up with [facility]. Pt states he will need a work note for 10/3 through time of discharge. Code Status: Full  Alfred Morton, MD PGY-1, [facility] Family Medicine FPTS Intern pager: [phone]

## 2012-05-16 NOTE — Progress Notes (Signed)
FMTS Attending Daily Note:  Renold Don MD  [phone] pager  Family Practice pager:  [phone] I have seen and examined this patient and have reviewed their chart. I have discussed this patient with the resident. I agree with the resident's findings, assessment and care plan  Much improved.  PO solids at lunch tolerated well. If continuing to tolerate PO solids this evening, can DC home at that point.

## 2012-05-17 MED ORDER — POLYETHYLENE GLYCOL 3350 17 G PO PACK
17.0000 g | PACK | Freq: Two times a day (BID) | ORAL | Status: DC
  Administered 2012-05-17 – 2012-05-18 (×2): 17 g via ORAL
  Filled 2012-05-17 (×3): qty 1

## 2012-05-17 MED ORDER — POLYETHYLENE GLYCOL 3350 17 G PO PACK
17.0000 g | PACK | Freq: Every day | ORAL | Status: DC
  Administered 2012-05-17: 17 g via ORAL
  Filled 2012-05-17: qty 1

## 2012-05-17 NOTE — ED Provider Notes (Signed)
Medical screening examination/treatment/procedure(s) were performed by non-physician practitioner and as supervising physician I was immediately available for consultation/collaboration.  Flonnie Wierman, MD 05/17/12 0034 

## 2012-05-17 NOTE — Progress Notes (Addendum)
Family Medicine Teaching Service Daily Progress Note Intern Pager: [phone]  Patient name: Alfred Gray Medical record number: [id number] Date of birth: 1966/02/16 Age: 46 y.o. Gender: male  Primary Care Provider: DEFAULT,PROVIDER, MD  Subjective: Pt seen at bedside. Some bloating yesterday afternoon/evening, passing gas, mildly increased pain. Still no BM. Doing better this morning, pain well controlled, tolerated breakfast.  UPDATE: Re-evaluated this afternoon. Pt still has not had a BM, feels okay but still with some bloating after eating. States he feels like he could go home, but states "I'd be afraid to eat, until I know my bowels are working right, first."  Objective: Temp:  [97.7 F (36.5 C)-99.7 F (37.6 C)] 97.9 F (36.6 C) (10/07 0900) Pulse Rate:  [74-90] 79  (10/07 0900) Resp:  [17-18] 18  (10/07 0900) BP: (136-182)/(73-96) 182/92 mmHg (10/07 0900) SpO2:  [98 %-100 %] 99 % (10/07 0900) Weight:  [185 lb 13.6 oz (84.3 kg)] 185 lb 13.6 oz (84.3 kg) (10/06 2216) Exam: General: adult male sitting up at bedside in NAD, alert and cooperative Cardiovascular: normal S1, S2, RRR Respiratory: CTAB, no increased work of breathing Abdomen: bowel sounds slightly more active this morning, pain much improved; minimally tender in epigastrium Extremities: moves all extremities equally, distal pulses 2+ and symmetric  Laboratory:  Lab 05/15/12 0840 05/14/12 0610 05/13/12 2358  WBC 12.6* 13.1* 12.8*  HGB 14.0 14.4 15.2  HCT 40.0 41.2 44.0  PLT 120* 230 231    Lab 05/15/12 0840 05/14/12 1815 05/14/12 0610  NA 136 137 139  K 3.9 4.6 3.5  CL 105 103 106  CO2 22 25 25   BUN 12 13 12   CREATININE 0.86 0.94 0.93  CALCIUM 8.8 9.0 8.7  PROT 6.6 6.8 6.6  BILITOT 1.4* 1.3* 1.2  ALKPHOS 58 65 62  ALT 28 39 46  AST 18 24 31   GLUCOSE 83 91 98  Lipase  10/3 @1825 : 1562  10/4 @0610 : 521  Imaging/Diagnostic Tests: Abdominal US, 10/3 @1952  IMPRESSION:  1. Cholelithiasis. Numerous  gallstones fill the gallbladder  lumen.  2. The gallbladder wall thickness is normal.  3. Question very mild micronodular contour the left lobe of the  liver. Early / mild hepatic cirrhosis cannot be excluded.  Assessment/Plan: Alfred Gray is a 46 y.o. year old male presenting with abdominal pain for approximately 48 hours with intermittent nausea and vomiting. Abdominal US shows multiple gallstones but no evidence for cholelithiasis. S/p 3L NS boluses in the ED, pain/nausea improved with Dilaudid, morphine, and Zofran. Etiology most likely pancreatitis, Ranson score 0 (<5% risk of mortality). Pain is much improved. Tolerating PO better this morning but still no BM, some "bloating" after eating solid foods.  1. Acute pancreatitis - On admission, lipase 1562, LDH 317, WBC mildly elevated at 12.6 on 10/5 with lipase improved to 521 on 10/4. Pt remains afebrile >24h, pain improved. Still no BM. Plan - Percocet 5-325 q4 for pain. Added Miralax, continue Colace. Zosyn discontinued, switched to Augmentin 10/6; discontinued 10/7. Zofran PRN for nausea. Likely involvement of gallstones, so will refer to surgery for elective cholecystectomy after resolution of acute illness, probably on an outpt basis.  2. Elevated BP since admission, 140's-150's/70's-90's.  Possible component of pain, but may require antihypertensive medications while inpt vs starting at follow-up. BP documented 182/92 10/7; rechecked at 140/96. Continue to monitor.  3. Family history of DM with mildly elevated glucose in the ED. A1c 5.8, so no need for any glucose management, currently.  4. Current smoker.  Will need smoking cessation counseling as outpt.  FEN/GI: [name] diet; advance diet slowly as tolerated  Prophylaxis: Lovenox  Disposition: Management as above, likely discharge home pending improvement of symptoms and ability to tolerate PO intake. Pt would like to follow up with [facility]. Pt states he will need a work note for 10/3  through time of discharge. Code Status: Full  Alfred Morton, MD PGY-1, [facility] Family Medicine FPTS Intern pager: [phone]

## 2012-05-17 NOTE — Progress Notes (Signed)
I discussed with  Dr Street.  I agree with their plans documented in their progress note for today.  

## 2012-05-18 ENCOUNTER — Encounter: Payer: Self-pay | Admitting: Family Medicine

## 2012-05-18 MED ORDER — POLYETHYLENE GLYCOL 3350 17 G PO PACK: 17.0000 g | PACK | Freq: Two times a day (BID) | ORAL | Status: DC

## 2012-05-18 MED ORDER — OXYCODONE-ACETAMINOPHEN 5-325 MG PO TABS: 1.0000 | ORAL_TABLET | Freq: Four times a day (QID) | ORAL | Status: DC | PRN

## 2012-05-18 MED ORDER — ONDANSETRON HCL 4 MG PO TABS: 4.0000 mg | ORAL_TABLET | Freq: Four times a day (QID) | ORAL | Status: AC | PRN

## 2012-05-18 MED ORDER — DSS 100 MG PO CAPS: 100.0000 mg | ORAL_CAPSULE | Freq: Two times a day (BID) | ORAL | Status: DC

## 2012-05-18 NOTE — Discharge Summary (Signed)
Family Medicine Teaching [facility] Discharge Summary  Patient name: Alfred Gray Medical record number: [id number] Date of birth: 1966-04-24 Age: 46 y.o. Gender: male Date of Admission: 05/13/2012  Date of Discharge: 10/8 Admitting Physician: Tobin Chad, MD  Primary Care Provider: Sheila Oats, MD (to be Maryjean Ka, MD of [facility], at follow-up)  Indication for Hospitalization: abdominal pain, nausea, vomiting x48 hours Discharge Diagnoses:  Acute pancreatitis Elevated blood pressure Fever (resolved 10/5, afebrile thereafter until discharge) Elevated blood pressure Tobacco abuse Family history of DM  Consultations: none  Significant Labs and Imaging:   Lab 05/15/12 0840 05/14/12 0610 05/13/12 2358  WBC 12.6* 13.1* 12.8*  HGB 14.0 14.4 15.2  HCT 40.0 41.2 44.0  PLT 120* 230 231    Lab 05/15/12 0840 05/14/12 1815 05/14/12 0610  NA 136 137 139  K 3.9 4.6 3.5  CL 105 103 106  CO2 22 25 25   BUN 12 13 12   CREATININE 0.86 0.94 0.93  CALCIUM 8.8 9.0 8.7  PROT 6.6 6.8 6.6  BILITOT 1.4* 1.3* 1.2  ALKPHOS 58 65 62  ALT 28 39 46  AST 18 24 31   GLUCOSE 83 91 98  Lipase 10/3 @1825 : 1562 (H)  10/4 @0610 : 521 LDH 10/3 @2026 : 317 Lipid panel 10/4  TG 90  HDL 53  LDL 86 A1c 10/4: 5.8  Procedures: none  Brief Hospital Course: Alfred Gray is a 46 y.o. year old male who presented with abdominal pain for approximately 48 hours with intermittent nausea and vomiting. Abdominal US showed multiple gallstones but no evidence for cholelithiasis. Pt received NS liter boluses x3 in the ED, and pain/nausea improved with Dilaudid, morphine, and Zofran. Etiology most likely pancreatitis, Ranson score 0 (<5% risk of mortality). Remainder of hospital course as follows by problem:    1. Acute pancreatitis - On admission, lipase 1562, LDH 317, WBC mildly elevated at 12.6 on 10/5 with lipase improved to 521 on 10/4. Pt spiked a fever on 10/4  to 101.6 and received two days of Zosyn followed by two days of Augmentin. Pain was controlled in the ED with morphine and Dilaudid IV, and throughout the rest of the hospital stay with Percocet PO. Pt improved over the next four days with gradually better PO tolerance and less pain; constipation was treated with Colace and Miralax. Pt remained afebrile and had two BMs on day of discharge with good relief of "bloating" discomfort and was able to easily tolerate solid foods without great pain.  2. Pt had some elevated BP's from time of admission to discharge, 140's-150's/70's-90's. BP documented 182/92 10/7; rechecked at 140/96. Possible component of pain, but may require antihypertensive medications while inpt vs starting at follow-up.    3. Pt has a family history of DM and had a mildly elevated glucose in the ED. A1c was found to be 5.8, so pt did not require glucose management while admitted.   4. Current smoker. Nursing provided smoking cessation information during admission. Pt will need continued smoking cessation counseling as outpt.  Discharge Exam: BP 147/93  Pulse 84  Temp 97.8 F (36.6 C) (Oral)  Resp 17  Ht 5\' 11"  (1.803 m)  Wt 184 lb 8.4 oz (83.7 kg)  BMI 25.74 kg/m2  SpO2 100% General: adult male sitting up in in NAD, alert and cooperative, appears much more comfortable than previous days Cardiovascular: normal S1, S2, RRR  Respiratory: CTAB, no increased work of breathing  Abdomen: normoactive bowel sounds, minimal tenderness to  palpation; slight "soreness" to deep palpation of epigastrium Extremities: moves all extremities equally, distal pulses 2+ and symmetric  Discharge Medications:    Medication List     As of 05/18/2012  8:12 PM    TAKE these medications         DSS 100 MG Caps   Take 100 mg by mouth 2 (two) times daily.      ondansetron 4 MG tablet   Commonly known as: ZOFRAN   Take 1 tablet (4 mg total) by mouth every 6 (six) hours as needed for nausea.        oxyCODONE-acetaminophen 5-325 MG per tablet   Commonly known as: PERCOCET/ROXICET   Take 1 tablet by mouth every 6 (six) hours as needed.      polyethylene glycol packet   Commonly known as: MIRALAX / GLYCOLAX   Take 17 g by mouth 2 (two) times daily.       Issues for Follow Up: 1. Please address any further abdominal pain. Try to move away from narcotics sooner rather than later. Also address BMs (pt instructed to take Miralax and/or Colace over the counter, as this helped while in-house.) 2. Please ensure pt follows up with surgery for possible elective cholecystectomy (abd ultrasound showed many gallstones, likely contributed to pancreatitis, without active/acute cholelithiasis). 3. F/u blood pressures; pt had one elevated to systolic of 188, and consistently had readings in the 130's-150's. 4. Address smoking cessation; pt stated just before discharge that he is down to a single cigarette per day and attempting to stop completely.  Outstanding Results: none  Discharge Instructions: Please refer to Patient Instructions section of EMR for full details.  Patient was counseled important signs and symptoms that should prompt return to medical care, changes in medications, dietary instructions, activity restrictions, and follow up appointments.      Follow-up Information    Follow up with Maryjean Ka, MD. On 05/27/2012. (Appt time is 3:00 PM.)    Contact information:   [address] [place] [address] [phone]       Follow up with Dortha Schwalbe., MD. On 05/31/2012. (Please arrive for appt at 1:20 PM.)    Contact information:   [address] [place] [address] [phone]          Discharge Condition: stable  [address] [place], MD 05/18/2012, 8:12 PM

## 2012-05-19 NOTE — Discharge Summary (Signed)
I discussed with  Dr Street.  I agree with their plans documented in their discharge note. 

## 2012-05-27 ENCOUNTER — Encounter: Payer: Self-pay | Admitting: Family Medicine

## 2012-05-27 ENCOUNTER — Ambulatory Visit (INDEPENDENT_AMBULATORY_CARE_PROVIDER_SITE_OTHER): Payer: Self-pay | Admitting: Family Medicine

## 2012-05-27 VITALS — BP 135/89 | HR 69 | Temp 98.3°F | Ht 71.0 in | Wt 188.0 lb

## 2012-05-27 DIAGNOSIS — K859 Acute pancreatitis without necrosis or infection, unspecified: Secondary | ICD-10-CM

## 2012-05-27 DIAGNOSIS — Z720 Tobacco use: Secondary | ICD-10-CM

## 2012-05-27 DIAGNOSIS — F17200 Nicotine dependence, unspecified, uncomplicated: Secondary | ICD-10-CM

## 2012-05-27 NOTE — Progress Notes (Signed)
  Subjective:    Patient ID: Alfred Gray, male    DOB: 12-30-1965, 46 y.o.   MRN: [id number]  HPI: Pt presents for hospital follow-up. Recently discharged for acute pancreatitis believed to be due to gallstones. Abd US showed many stones but no acute cholecystitis. Pt has an appointment with Dr. Luisa Hart Monday 10/21 to discuss possible elective surgery for cholecystectomy.  Since discharge, has been doing well. Some occasional nausea but no more vomiting. Pain is much better. Pt only required pain medicine (Percocet) for two days after discharge on the 8th. Still with occasional "tightness" in his epigastrium but no frank abdominal pain any longer. Still avoiding spicy, fatty foods. Good BM's, still using Colace "just to be safe."  Smoking status reviewed. Pt down to 5 cigarettes per day, which is much better than his previous pack-a-day or more. Pt also brings in paperwork for FMLA to be completed and faxed to his employer.  Review of Systems: As above. Otherwise denies chest pain, shortness of breath, fever/chills, muscle pains/weakness.     Objective:   Physical Exam BP 135/89  Pulse 69  Temp 98.3 F (36.8 C) (Oral)  Ht 5\' 11"  (1.803 m)  Wt 188 lb (85.276 kg)  BMI 26.22 kg/m2 Gen: well-appearing adult male, very pleasant Cardio: RRR, no rub/gallop, normal S1, S2 Pulm: CTAB, no wheezes Abd: soft, nontender, nondistended, normal bowel sounds; very slight soreness in epigastrium to deep palpation, no LUQ or RUQ pain Ext: moves all extremities equally, normal gait, distal pulses 2+ and symmetric     Assessment & Plan:

## 2012-05-27 NOTE — Assessment & Plan Note (Signed)
A: Resolved as of 10/8 (date of discharge from hospital). Most likely due to gallstones; [place] during admission 10/3-10/8 showed stones but no acute cholecystitis. Currently doing well, not requiring narcotics. Nausea mostly resolved, only occasional with no more vomiting.  P: Follow through with visit scheduled Monday 10/21 with Dr. Luisa Hart for discussion of possible elective cholecystectomy. Also filled out FMLA paperwork to be faxed to employer.

## 2012-05-27 NOTE — Patient Instructions (Addendum)
Thanks for coming in, today! It was good to see you. Your blood pressure looks good, today, so there's no need to start any medications yet. Check your blood pressure occasionally if you're out at the drug store or [employer]. If it reads over 160 on the top or over 90 on the bottom, come back in. Make sure to keep your appointment with Dr. Luisa Hart on Monday. Keep working on stopping smoking! You can do it! Schedule an appointment with me in the next few months, as you need. Please feel free to call the clinic any time with questions. --Dr. Casper Harrison

## 2012-05-27 NOTE — Assessment & Plan Note (Signed)
Still smoking 5 cigarettes per day. Reinforced cessation options.

## 2012-05-31 ENCOUNTER — Encounter (INDEPENDENT_AMBULATORY_CARE_PROVIDER_SITE_OTHER): Payer: Self-pay | Admitting: Surgery

## 2012-05-31 ENCOUNTER — Ambulatory Visit (INDEPENDENT_AMBULATORY_CARE_PROVIDER_SITE_OTHER): Payer: Self-pay | Admitting: Surgery

## 2012-05-31 VITALS — BP 132/86 | HR 72 | Temp 97.3°F | Resp 12 | Ht 71.0 in | Wt 191.2 lb

## 2012-05-31 DIAGNOSIS — K8020 Calculus of gallbladder without cholecystitis without obstruction: Secondary | ICD-10-CM

## 2012-05-31 NOTE — Progress Notes (Signed)
Patient ID: Alfred Gray, male   DOB: May 24, 1966, 46 y.o.   MRN: [id number]  No chief complaint on file.   HPI Alfred Gray is a 46 y.o. male.  Patient sent at the request of Dr. Casper Harrison due to history of gallstone pancreatitis. He was admitted earlier in the month with abdominal pain, nausea and vomiting. The pain was located in his right upper quadrant and radiated to his back. Pain made worse by eating fatty foods. He was found to have pancreatitis and managed medically and discharged from the hospital. He has had no further attacks since. He is watching his diet. HPI  No past medical history on file.  Past Surgical History  Procedure Date  . Wrist surgery   . Wrist surgery     Family History  Problem Relation Age of Onset  . Diabetes Mother   . Hypertension      Various family members    Social History History  Substance Use Topics  . Smoking status: Current Every Day Smoker -- 0.3 packs/day    Types: Cigarettes  . Smokeless tobacco: Not on file  . Alcohol Use: No    No Known Allergies  Current Outpatient Prescriptions  Medication Sig Dispense Refill  . Docusate Sodium (DSS) 100 MG CAPS Take 100 mg by mouth 2 (two) times daily.  14 each  0  . ondansetron (ZOFRAN) 4 MG tablet Take 1 tablet (4 mg total) by mouth every 6 (six) hours as needed for nausea.  20 tablet  0  . oxyCODONE-acetaminophen (PERCOCET/ROXICET) 5-325 MG per tablet Take 1 tablet by mouth every 6 (six) hours as needed.  30 tablet  0    Review of Systems Review of Systems  Constitutional: Negative for fever, chills and unexpected weight change.  HENT: Negative for hearing loss, congestion, sore throat, trouble swallowing and voice change.   Eyes: Negative for visual disturbance.  Respiratory: Negative for cough and wheezing.   Cardiovascular: Negative for chest pain, palpitations and leg swelling.  Gastrointestinal: Negative for nausea, vomiting, abdominal pain, diarrhea, constipation, blood in stool,  abdominal distention, anal bleeding and rectal pain.  Genitourinary: Negative for hematuria and difficulty urinating.  Musculoskeletal: Negative for arthralgias.  Skin: Negative for rash and wound.  Neurological: Negative for seizures, syncope, weakness and headaches.  Hematological: Negative for adenopathy. Does not bruise/bleed easily.  Psychiatric/Behavioral: Negative for confusion.    Blood pressure 132/86, pulse 72, temperature 97.3 F (36.3 C), resp. rate 12, height 5\' 11"  (1.803 m), weight 191 lb 3.2 oz (86.728 kg).  Physical Exam Physical Exam  Constitutional: He is oriented to person, place, and time. He appears well-developed and well-nourished.  HENT:  Head: Normocephalic and atraumatic.  Eyes: EOM are normal. Pupils are equal, round, and reactive to light.  Neck: Normal range of motion. Neck supple.  Cardiovascular: Normal rate and regular rhythm.   Pulmonary/Chest: Effort normal and breath sounds normal.  Abdominal: Soft. Bowel sounds are normal.  Musculoskeletal: Normal range of motion.  Neurological: He is alert and oriented to person, place, and time.  Skin: Skin is warm and dry.  Psychiatric: He has a normal mood and affect. His behavior is normal. Judgment and thought content normal.    Data Reviewed *RADIOLOGY REPORT*  Clinical Data: Right upper quadrant pain, nausea, and vomiting.  ABDOMINAL ULTRASOUND COMPLETE  Comparison: None.  Findings:  Gallbladder: A wall echo shadow complex is seen within the  gallbladder, indicating numerous gallstones filling the gallbladder  lumen. Gallbladder wall thickness is  normal (1.9 mm). The  sonographic Murphy's sign is negative.  Common Bile Duct: Within normal limits in caliber. Measures 3.8 mm  where visualized.  Liver: 8 x 5 x 7 mm cyst is seen in the left lobe of the liver.  Within normal limits in parenchymal echogenicity. On images number  39 and 40 of the left lobe of the liver, there is suggestion of  slight  micronodularity of the liver contour. Early hepatic  cirrhosis cannot be excluded. There are no prior imaging studies  of the liver for comparison. The portal vein is patent and  demonstrates normal direction of flow.  IVC: Appears normal.  Pancreas: Although the pancreas is difficult to visualize in its  entirety, no focal pancreatic abnormality is identified. Distal  pancreas is obscured by bowel gas.  Spleen: Within normal limits in size and echotexture.  Right kidney: Normal in size and parenchymal echogenicity. No  evidence of mass or hydronephrosis.  Left kidney: Normal in size and parenchymal echogenicity. No  evidence of mass or hydronephrosis.  Abdominal Aorta: No aneurysm identified.  No ascites is identified.  IMPRESSION:  1. Cholelithiasis. Numerous gallstones fill the gallbladder  lumen.  2. The gallbladder wall thickness is normal.  3. Question very mild micronodular contour the left lobe of the  liver. Early / mild hepatic cirrhosis cannot be excluded.  Original Report Authenticated By: Britta Mccreedy, M.D.         Assessment    History of gallstone pancreatitis    Plan    Laparoscopic cholecystectomy and cholangiogram.The procedure has been discussed with the patient. Operative and non operative treatments have been discussed. Risks of surgery include bleeding, infection,  Common bile duct injury,  Injury to the stomach,liver, colon,small intestine, abdominal wall,  Diaphragm,  Major blood vessels,  And the need for an open procedure.  Other risks include worsening of medical problems, death,  DVT and pulmonary embolism, and cardiovascular events.   Medical options have also been discussed. The patient has been informed of long term expectations of surgery and non surgical options,  The patient agrees to proceed.         Aayla Marrocco A. 05/31/2012, 2:00 PM

## 2012-05-31 NOTE — Patient Instructions (Signed)
Laparoscopic Cholecystectomy Laparoscopic cholecystectomy is surgery to remove the gallbladder. The gallbladder is located slightly to the right of center in the abdomen, behind the liver. It is a concentrating and storage sac for the bile produced in the liver. Bile aids in the digestion and absorption of fats. Gallbladder disease (cholecystitis) is an inflammation of your gallbladder. This condition is usually caused by a buildup of gallstones (cholelithiasis) in your gallbladder. Gallstones can block the flow of bile, resulting in inflammation and pain. In severe cases, emergency surgery may be required. When emergency surgery is not required, you will have time to prepare for the procedure. Laparoscopic surgery is an alternative to open surgery. Laparoscopic surgery usually has a shorter recovery time. Your common bile duct may also need to be examined and explored. Your caregiver will discuss this with you if he or she feels this should be done. If stones are found in the common bile duct, they may be removed. LET YOUR CAREGIVER KNOW ABOUT:  Allergies to food or medicine.  Medicines taken, including vitamins, herbs, eyedrops, over-the-counter medicines, and creams.  Use of steroids (by mouth or creams).  Previous problems with anesthetics or numbing medicines.  History of bleeding problems or blood clots.  Previous surgery.  Other health problems, including diabetes and kidney problems.  Possibility of pregnancy, if this applies. RISKS AND COMPLICATIONS All surgery is associated with risks. Some problems that may occur following this procedure include:  Infection.  Damage to the common bile duct, nerves, arteries, veins, or other internal organs such as the stomach or intestines.  Bleeding.  A stone may remain in the common bile duct. BEFORE THE PROCEDURE  Do not take aspirin for 3 days prior to surgery or blood thinners for 1 week prior to surgery.  Do not eat or drink  anything after midnight the night before surgery.  Let your caregiver know if you develop a cold or other infectious problem prior to surgery.  You should be present 60 minutes before the procedure or as directed. PROCEDURE  You will be given medicine that makes you sleep (general anesthetic). When you are asleep, your surgeon will make several small cuts (incisions) in your abdomen. One of these incisions is used to insert a small, lighted scope (laparoscope) into the abdomen. The laparoscope helps the surgeon see into your abdomen. Carbon dioxide gas will be pumped into your abdomen. The gas allows more room for the surgeon to perform your surgery. Other operating instruments are inserted through the other incisions. Laparoscopic procedures may not be appropriate when:  There is major scarring from previous surgery.  The gallbladder is extremely inflamed.  There are bleeding disorders or unexpected cirrhosis of the liver.  A pregnancy is near term.  Other conditions make the laparoscopic procedure impossible. If your surgeon feels it is not safe to continue with a laparoscopic procedure, he or she will perform an open abdominal procedure. In this case, the surgeon will make an incision to open the abdomen. This gives the surgeon a larger view and field to work within. This may allow the surgeon to perform procedures that sometimes cannot be performed with a laparoscope alone. Open surgery has a longer recovery time. AFTER THE PROCEDURE  You will be taken to the recovery area where a nurse will watch and check your progress.  You may be allowed to go home the same day.  Do not resume physical activities until directed by your caregiver.  You may resume a normal diet and   activities as directed. Document Released: 07/28/2005 Document Revised: 10/20/2011 Document Reviewed: 01/10/2011 [employer] Patient Information 2013 [employer]     Laparoscopic Cholecystectomy Care After Refer  to this sheet in the next few weeks. These instructions provide you with information on caring for yourself after your procedure. Your caregiver may also give you more specific instructions. Your treatment has been planned according to current medical practices, but problems sometimes occur. Call your caregiver if you have any problems or questions after your procedure. HOME CARE INSTRUCTIONS   Change bandages (dressings) as directed by your caregiver.  Keep the wound dry and clean. The wound may be washed gently with soap and water. Gently blot or dab the area dry.  Do not take baths or use swimming pools or hot tubs for 10 days, or as instructed by your caregiver.  Only take over-the-counter or prescription medicines for pain, discomfort, or fever as directed by your caregiver.  Continue your normal diet as directed by your caregiver.  Do not lift anything heavier than 25 pounds (11.5 kg), or as directed by your caregiver.  Do not play contact sports for 1 week, or as directed by your caregiver. SEEK MEDICAL CARE IF:   There is redness, swelling, or increasing pain in the wound.  You notice yellowish-white fluid (pus) coming from the wound.  There is drainage from the wound that lasts longer than 1 day.  There is a bad smell coming from the wound or dressing.  The surgical cut (incision) breaks open. SEEK IMMEDIATE MEDICAL CARE IF:   You develop a rash.  You have difficulty breathing.  You develop chest pain.  You develop any reaction or side effects to medicines given.  You have a fever.  You have increasing pain in the shoulders (shoulder strap areas).  You have dizzy episodes or faint while standing.  You develop severe abdominal pain.  You feel sick to your stomach (nauseous) or throw up (vomit) and this lasts for more than 1 day. MAKE SURE YOU:   Understand these instructions.  Will watch your condition.  Will get help right away if you are not doing well or  get worse. Document Released: 07/28/2005 Document Revised: 10/20/2011 Document Reviewed: 01/10/2011 [employer] Patient Information 2013 [employer].   

## 2012-06-02 ENCOUNTER — Encounter (INDEPENDENT_AMBULATORY_CARE_PROVIDER_SITE_OTHER): Payer: Self-pay

## 2012-11-05 ENCOUNTER — Encounter (INDEPENDENT_AMBULATORY_CARE_PROVIDER_SITE_OTHER): Payer: Self-pay | Admitting: Surgery

## 2012-11-05 ENCOUNTER — Encounter (INDEPENDENT_AMBULATORY_CARE_PROVIDER_SITE_OTHER): Payer: Self-pay

## 2012-11-05 ENCOUNTER — Ambulatory Visit (INDEPENDENT_AMBULATORY_CARE_PROVIDER_SITE_OTHER): Payer: Self-pay | Admitting: Surgery

## 2012-11-05 VITALS — BP 132/72 | HR 74 | Temp 98.0°F | Resp 18 | Ht 71.0 in | Wt 187.0 lb

## 2012-11-05 DIAGNOSIS — K8050 Calculus of bile duct without cholangitis or cholecystitis without obstruction: Secondary | ICD-10-CM

## 2012-11-05 DIAGNOSIS — K8020 Calculus of gallbladder without cholecystitis without obstruction: Secondary | ICD-10-CM

## 2012-11-05 NOTE — Progress Notes (Signed)
Patient ID: Alfred Gray, male   DOB: 1966-06-12, 47 y.o.   MRN: [id number]  Chief Complaint  Patient presents with  . Follow-up    discuss G.B sx    HPI Alfred Gray is a 47 y.o. male.  Patient sent at the request of Dr. Casper Harrison due to history of gallstone pancreatitis. He was admitted earlier in the month with abdominal pain, nausea and vomiting. The pain was located in his right upper quadrant and radiated to his back. Pain made worse by eating fatty foods. He was found to have pancreatitis and managed medically and discharged from the hospital. He has had no further attacks since. He is watching his diet. He was evaluated in 05/2012 for cholecystectomy but did not follow up.  Has been having some nausea and intermittent RUQ pain after eating.  Not severe like pancreatitis as before.  Pian better with low fat diet. HPI  No past medical history on file.  Past Surgical History  Procedure Laterality Date  . Wrist surgery    . Wrist surgery      Family History  Problem Relation Age of Onset  . Diabetes Mother   . Hypertension      Various family members    Social History History  Substance Use Topics  . Smoking status: Current Every Day Smoker -- 0.30 packs/day    Types: Cigarettes  . Smokeless tobacco: Not on file  . Alcohol Use: No    No Known Allergies  Current Outpatient Prescriptions  Medication Sig Dispense Refill  . Docusate Sodium (DSS) 100 MG CAPS Take 100 mg by mouth 2 (two) times daily.  14 each  0  . ondansetron (ZOFRAN) 4 MG tablet Take 1 tablet (4 mg total) by mouth every 6 (six) hours as needed for nausea.  20 tablet  0  . oxyCODONE-acetaminophen (PERCOCET/ROXICET) 5-325 MG per tablet Take 1 tablet by mouth every 6 (six) hours as needed.  30 tablet  0   No current facility-administered medications for this visit.    Review of Systems Review of Systems  Constitutional: Negative for fever, chills and unexpected weight change.  HENT: Negative for  hearing loss, congestion, sore throat, trouble swallowing and voice change.   Eyes: Negative for visual disturbance.  Respiratory: Negative for cough and wheezing.   Cardiovascular: Negative for chest pain, palpitations and leg swelling.  Gastrointestinal: Negative for nausea, vomiting, abdominal pain, diarrhea, constipation, blood in stool, abdominal distention, anal bleeding and rectal pain.  Genitourinary: Negative for hematuria and difficulty urinating.  Musculoskeletal: Negative for arthralgias.  Skin: Negative for rash and wound.  Neurological: Negative for seizures, syncope, weakness and headaches.  Hematological: Negative for adenopathy. Does not bruise/bleed easily.  Psychiatric/Behavioral: Negative for confusion.    Blood pressure 132/72, pulse 74, temperature 98 F (36.7 C), resp. rate 18, height 5\' 11"  (1.803 m), weight 187 lb (84.823 kg).  Physical Exam Physical Exam  Constitutional: He is oriented to person, place, and time. He appears well-developed and well-nourished.  HENT:  Head: Normocephalic and atraumatic.  Eyes: EOM are normal. Pupils are equal, round, and reactive to light.  Neck: Normal range of motion. Neck supple.  Cardiovascular: Normal rate and regular rhythm.   Pulmonary/Chest: Effort normal and breath sounds normal.  Abdominal: Soft. Bowel sounds are normal.  soft non tender.  Negative Murphy's sign. Musculoskeletal: Normal range of motion.  Neurological: He is alert and oriented to person, place, and time.  Skin: Skin is warm and dry.  Psychiatric: He has a normal mood and affect. His behavior is normal. Judgment and thought content normal.    Data Reviewed *RADIOLOGY REPORT*  Clinical Data: Right upper quadrant pain, nausea, and vomiting.  ABDOMINAL ULTRASOUND COMPLETE  Comparison: None.  Findings:  Gallbladder: A wall echo shadow complex is seen within the  gallbladder, indicating numerous gallstones filling the gallbladder  lumen. Gallbladder  wall thickness is normal (1.9 mm). The  sonographic Murphy's sign is negative.  Common Bile Duct: Within normal limits in caliber. Measures 3.8 mm  where visualized.  Liver: 8 x 5 x 7 mm cyst is seen in the left lobe of the liver.  Within normal limits in parenchymal echogenicity. On images number  39 and 40 of the left lobe of the liver, there is suggestion of  slight micronodularity of the liver contour. Early hepatic  cirrhosis cannot be excluded. There are no prior imaging studies  of the liver for comparison. The portal vein is patent and  demonstrates normal direction of flow.  IVC: Appears normal.  Pancreas: Although the pancreas is difficult to visualize in its  entirety, no focal pancreatic abnormality is identified. Distal  pancreas is obscured by bowel gas.  Spleen: Within normal limits in size and echotexture.  Right kidney: Normal in size and parenchymal echogenicity. No  evidence of mass or hydronephrosis.  Left kidney: Normal in size and parenchymal echogenicity. No  evidence of mass or hydronephrosis.  Abdominal Aorta: No aneurysm identified.  No ascites is identified.  IMPRESSION:  1. Cholelithiasis. Numerous gallstones fill the gallbladder  lumen.  2. The gallbladder wall thickness is normal.  3. Question very mild micronodular contour the left lobe of the  liver. Early / mild hepatic cirrhosis cannot be excluded.  Original Report Authenticated By: Britta Mccreedy, M.D.         Assessment    History of gallstone pancreatitis Biliary colic symptoms increasing.     Plan    Laparoscopic cholecystectomy and cholangiogram.The procedure has been discussed with the patient. Operative and non operative treatments have been discussed. Risks of surgery include bleeding, infection,  Common bile duct injury,  Injury to the stomach,liver, colon,small intestine, abdominal wall,  Diaphragm,  Major blood vessels,  And the need for an open procedure.  Other risks include  worsening of medical problems, death,  DVT and pulmonary embolism, and cardiovascular events.   Medical options have also been discussed. The patient has been informed of long term expectations of surgery and non surgical options,  The patient agrees to proceed.         Strider Vallance A. 11/05/2012, 10:47 AM

## 2012-11-05 NOTE — Patient Instructions (Addendum)
Laparoscopic Cholecystectomy Care After Refer to this sheet in the next few weeks. These instructions provide you with information on caring for yourself after your procedure. Your caregiver may also give you more specific instructions. Your treatment has been planned according to current medical practices, but problems sometimes occur. Call your caregiver if you have any problems or questions after your procedure. HOME CARE INSTRUCTIONS   Change bandages (dressings) as directed by your caregiver.  Keep the wound dry and clean. The wound may be washed gently with soap and water. Gently blot or dab the area dry.  Do not take baths or use swimming pools or hot tubs for 10 days, or as instructed by your caregiver.  Only take over-the-counter or prescription medicines for pain, discomfort, or fever as directed by your caregiver.  Continue your normal diet as directed by your caregiver.  Do not lift anything heavier than 25 pounds (11.5 kg), or as directed by your caregiver.  Do not play contact sports for 1 week, or as directed by your caregiver. SEEK MEDICAL CARE IF:   There is redness, swelling, or increasing pain in the wound.  You notice yellowish-white fluid (pus) coming from the wound.  There is drainage from the wound that lasts longer than 1 day.  There is a bad smell coming from the wound or dressing.  The surgical cut (incision) breaks open. SEEK IMMEDIATE MEDICAL CARE IF:   You develop a rash.  You have difficulty breathing.  You develop chest pain.  You develop any reaction or side effects to medicines given.  You have a fever.  You have increasing pain in the shoulders (shoulder strap areas).  You have dizzy episodes or faint while standing.  You develop severe abdominal pain.  You feel sick to your stomach (nauseous) or throw up (vomit) and this lasts for more than 1 day. MAKE SURE YOU:   Understand these instructions.  Will watch your condition.  Will  get help right away if you are not doing well or get worse. Document Released: 07/28/2005 Document Revised: 10/20/2011 Document Reviewed: 01/10/2011 [facility] Patient Information 2013 [place], [place]. Cholelithiasis Cholelithiasis (also called gallstones) is a form of gallbladder disease where gallstones form in your gallbladder. The gallbladder is a non-essential organ that stores bile made in the liver, which helps digest fats. Gallstones begin as small crystals and slowly grow into stones. Gallstone pain occurs when the gallbladder spasms, and a gallstone is blocking the duct. Pain can also occur when a stone passes out of the duct.  Women are more likely to develop gallstones than men. Other factors that increase the risk of gallbladder disease are:  Having multiple pregnancies. Physicians sometimes advise removing diseased gallbladders before future pregnancies.  Obesity.  Diets heavy in fried foods and fat.  Increasing age (older than 70).  Prolonged use of medications containing male hormones.  Diabetes mellitus.  Rapid weight loss.  Family history of gallstones (heredity). SYMPTOMS  Feeling sick to your stomach (nauseous).  Abdominal pain.  Yellowing of the skin (jaundice).  Sudden pain. It may persist from several minutes to several hours.  Worsening pain with deep breathing or when jarred.  Fever.  Tenderness to the touch. In some cases, when gallstones do not move into the bile duct, people have no pain or symptoms. These are called "silent" gallstones. TREATMENT In severe cases, emergency surgery may be required. HOME CARE INSTRUCTIONS   Only take over-the-counter or prescription medicines for pain, discomfort, or fever as directed by  your caregiver.  Follow a low-fat diet until seen again. Fat causes the gallbladder to contract, which can result in pain.  Follow up as instructed. Attacks are almost always recurrent and surgery is usually required for  permanent treatment. SEEK IMMEDIATE MEDICAL CARE IF:   Your pain increases and is not controlled by medications.  You have an oral temperature above 102 F (38.9 C), not controlled by medication.  You develop nausea and vomiting. MAKE SURE YOU:   Understand these instructions.  Will watch your condition.  Will get help right away if you are not doing well or get worse. Document Released: 07/24/2005 Document Revised: 10/20/2011 Document Reviewed: 09/26/2010 [facility] Patient Information 2013 [place], [place]. Laparoscopic Cholecystectomy Laparoscopic cholecystectomy is surgery to remove the gallbladder. The gallbladder is located slightly to the right of center in the abdomen, behind the liver. It is a concentrating and storage sac for the bile produced in the liver. Bile aids in the digestion and absorption of fats. Gallbladder disease (cholecystitis) is an inflammation of your gallbladder. This condition is usually caused by a buildup of gallstones (cholelithiasis) in your gallbladder. Gallstones can block the flow of bile, resulting in inflammation and pain. In severe cases, emergency surgery may be required. When emergency surgery is not required, you will have time to prepare for the procedure. Laparoscopic surgery is an alternative to open surgery. Laparoscopic surgery usually has a shorter recovery time. Your common bile duct may also need to be examined and explored. Your caregiver will discuss this with you if he or she feels this should be done. If stones are found in the common bile duct, they may be removed. LET YOUR CAREGIVER KNOW ABOUT:  Allergies to food or medicine.  Medicines taken, including vitamins, herbs, eyedrops, over-the-counter medicines, and creams.  Use of steroids (by mouth or creams).  Previous problems with anesthetics or numbing medicines.  History of bleeding problems or blood clots.  Previous surgery.  Other health problems, including diabetes and  kidney problems.  Possibility of pregnancy, if this applies. RISKS AND COMPLICATIONS All surgery is associated with risks. Some problems that may occur following this procedure include:  Infection.  Damage to the common bile duct, nerves, arteries, veins, or other internal organs such as the stomach or intestines.  Bleeding.  A stone may remain in the common bile duct. BEFORE THE PROCEDURE  Do not take aspirin for 3 days prior to surgery or blood thinners for 1 week prior to surgery.  Do not eat or drink anything after midnight the night before surgery.  Let your caregiver know if you develop a cold or other infectious problem prior to surgery.  You should be present 60 minutes before the procedure or as directed. PROCEDURE  You will be given medicine that makes you sleep (general anesthetic). When you are asleep, your surgeon will make several small cuts (incisions) in your abdomen. One of these incisions is used to insert a small, lighted scope (laparoscope) into the abdomen. The laparoscope helps the surgeon see into your abdomen. Carbon dioxide gas will be pumped into your abdomen. The gas allows more room for the surgeon to perform your surgery. Other operating instruments are inserted through the other incisions. Laparoscopic procedures may not be appropriate when:  There is major scarring from previous surgery.  The gallbladder is extremely inflamed.  There are bleeding disorders or unexpected cirrhosis of the liver.  A pregnancy is near term.  Other conditions make the laparoscopic procedure impossible. If your surgeon  feels it is not safe to continue with a laparoscopic procedure, he or she will perform an open abdominal procedure. In this case, the surgeon will make an incision to open the abdomen. This gives the surgeon a larger view and field to work within. This may allow the surgeon to perform procedures that sometimes cannot be performed with a laparoscope alone.  Open surgery has a longer recovery time. AFTER THE PROCEDURE  You will be taken to the recovery area where a nurse will watch and check your progress.  You may be allowed to go home the same day.  Do not resume physical activities until directed by your caregiver.  You may resume a normal diet and activities as directed. Document Released: 07/28/2005 Document Revised: 10/20/2011 Document Reviewed: 01/10/2011 [facility] Patient Information 2013 [place], [place].

## 2013-11-05 IMAGING — US US ABDOMEN COMPLETE
1 series · 13 of 25 positions shown · non-contrast
Comparison: None.

CLINICAL DATA: Right upper quadrant pain, nausea, and vomiting.

ABDOMINAL ULTRASOUND COMPLETE

[Series 1: us abdomen complete · 0.30mm/px · 111 acquisitions, 13 frames shown]
[im 1/111]
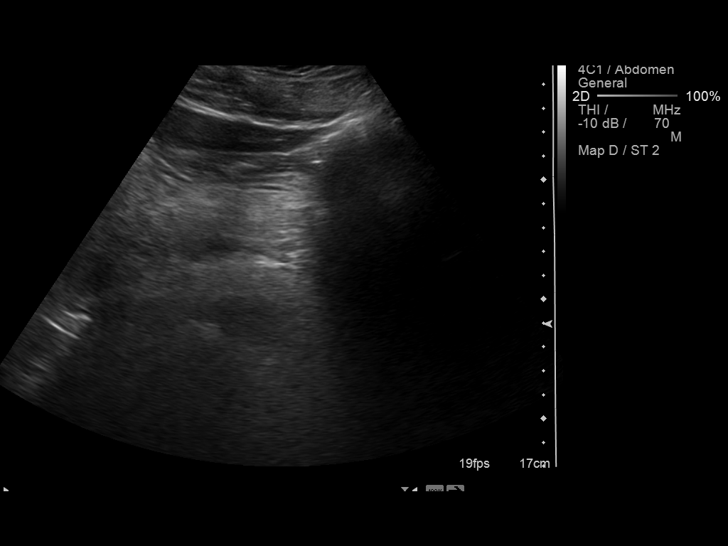
[im 10/111]
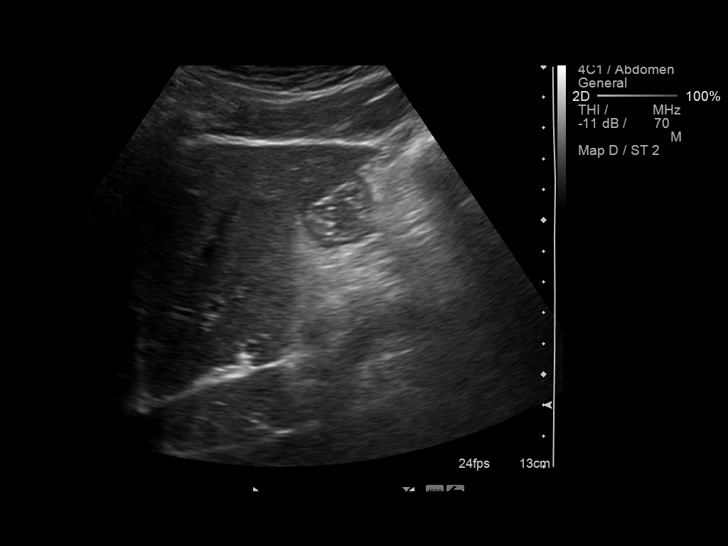
[im 19/111]
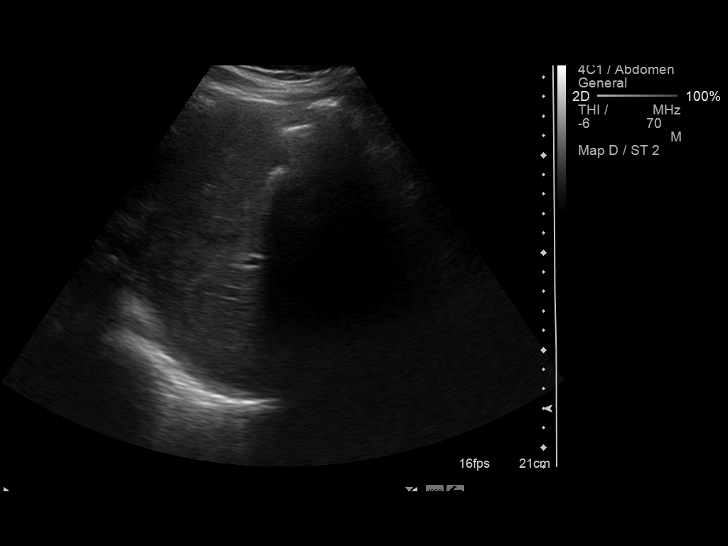
[im 28/111]
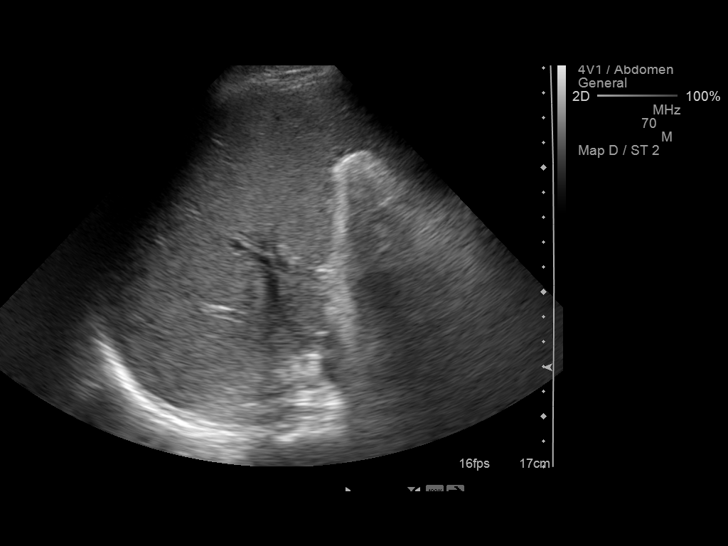
[im 37/111]
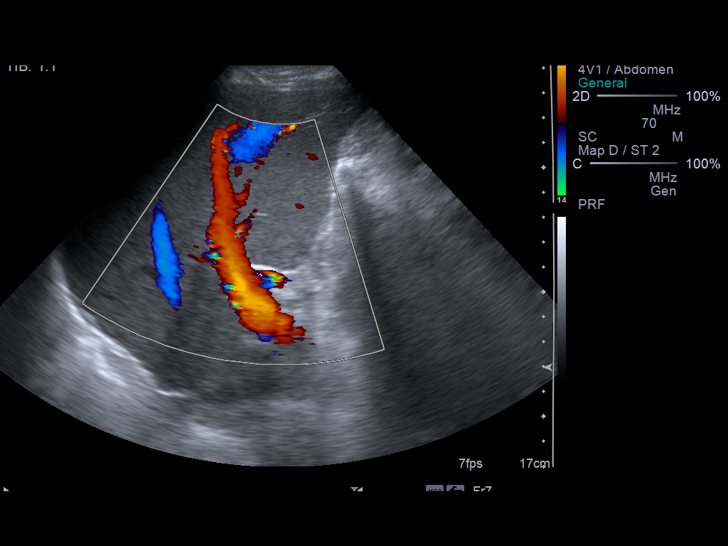
[im 46/111]
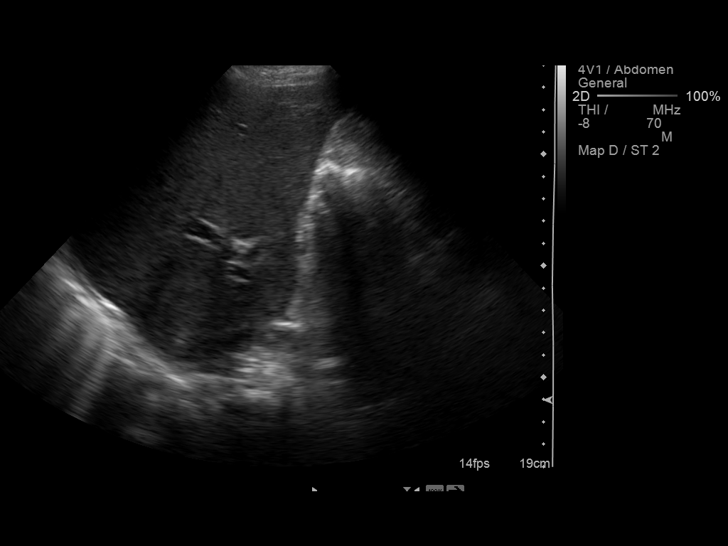
[im 56/111]
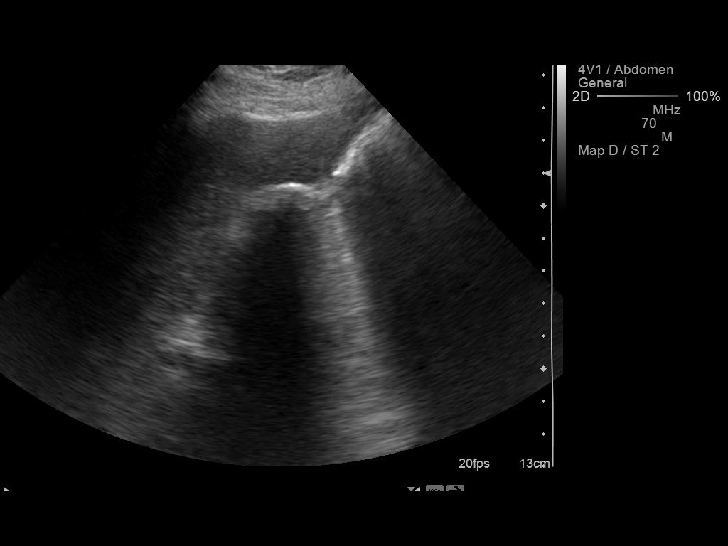
[im 65/111]
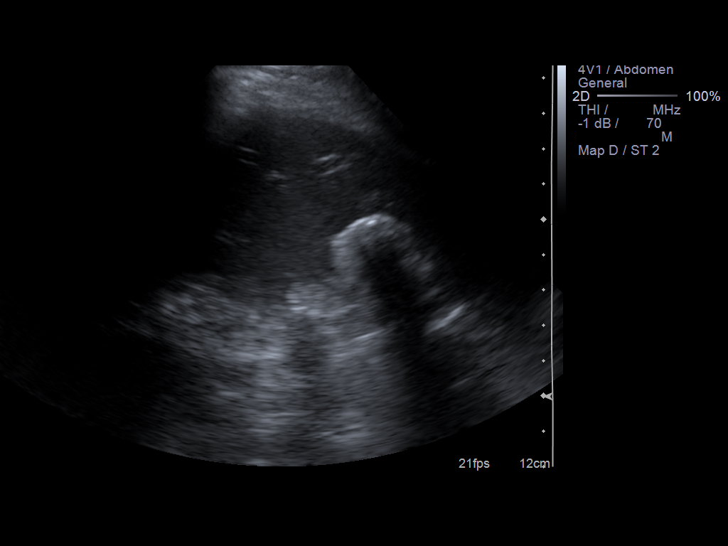
[im 74/111]
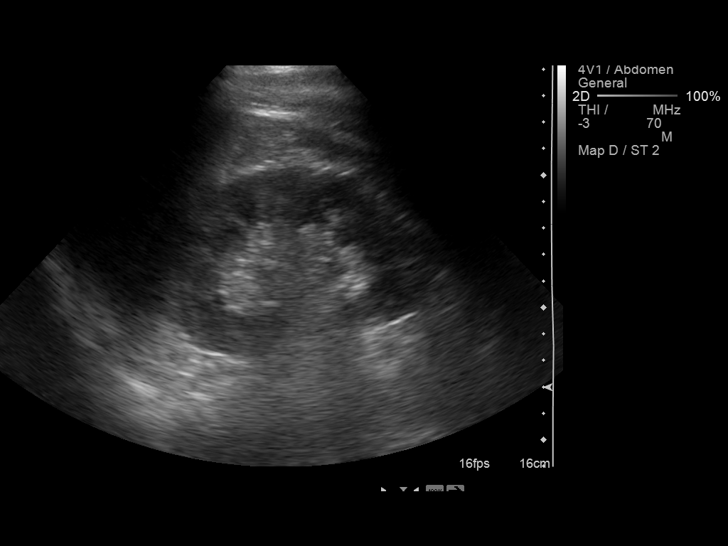
[im 83/111]
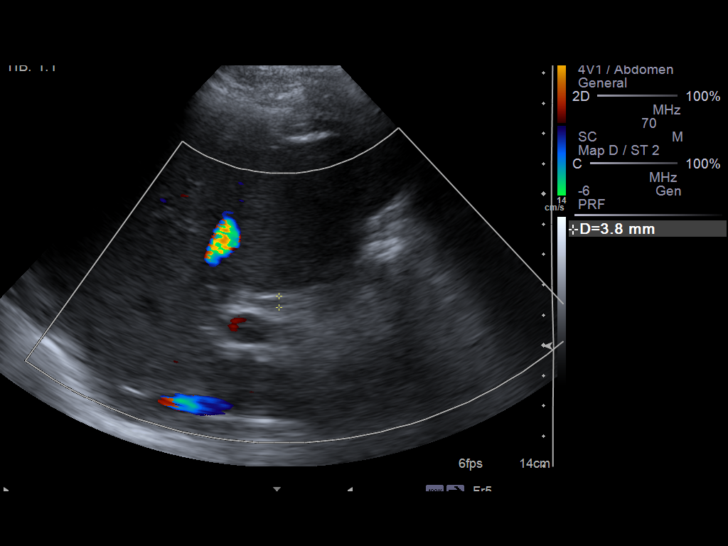
[im 92/111]
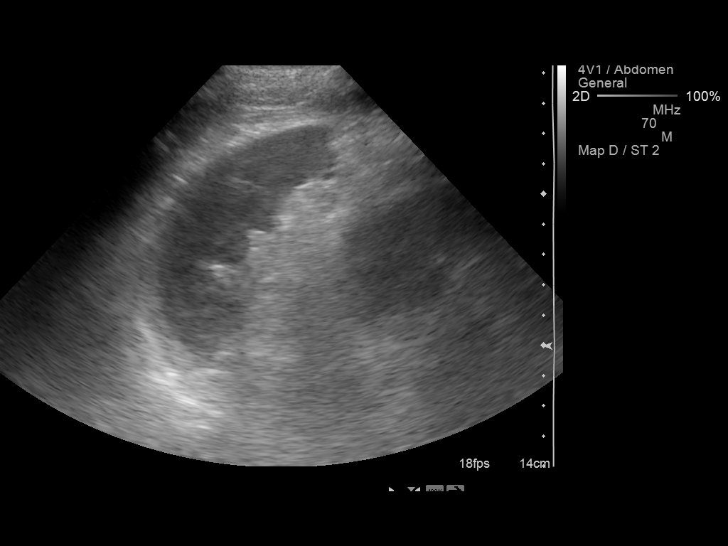
[im 101/111]
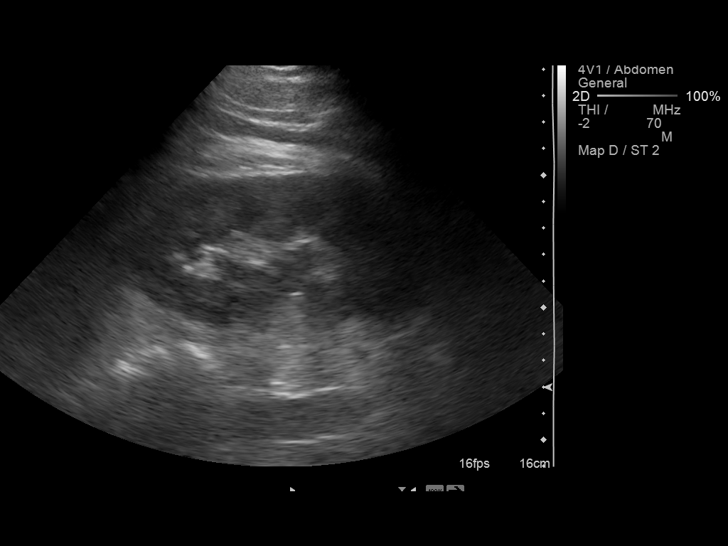
[im 111/111]
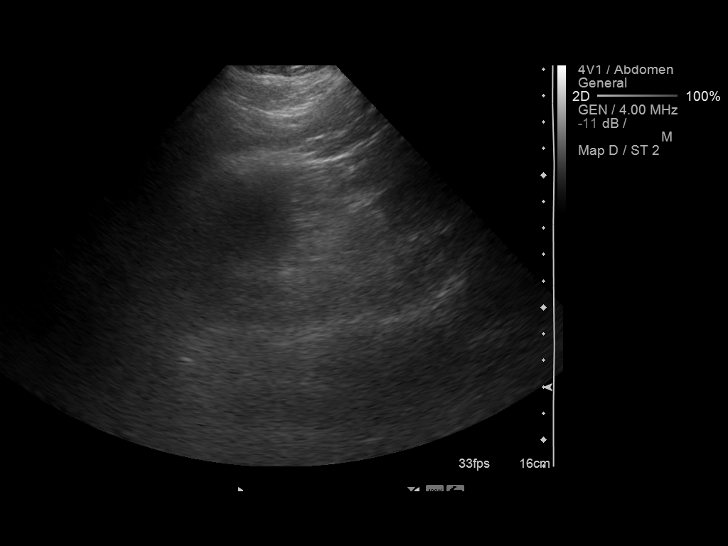

[13 of 25 positions shown; findings below may reference images not displayed]

FINDINGS: Gallbladder:  A wall echo shadow complex is seen within the
gallbladder, indicating numerous gallstones filling the gallbladder
lumen.  Gallbladder wall thickness is normal (1.9 mm).  The
sonographic Murphy's sign is negative.

Common Bile Duct:  Within normal limits in caliber. Measures 3.8 mm
where visualized.

Liver: 8 x 5 x 7 mm cyst is seen in the left lobe of the liver.
Within normal limits in parenchymal echogenicity. On images number
39 and 40 of the left lobe of the liver, there is suggestion of
slight micronodularity of the liver contour.  Early hepatic
cirrhosis cannot be excluded.  There are no prior imaging studies
of the liver for comparison.  The portal vein is patent and
demonstrates normal direction of flow.

IVC:  Appears normal.

Pancreas:  Although the pancreas is difficult to visualize in its
entirety, no focal pancreatic abnormality is identified. Distal
pancreas is obscured by bowel gas.

Spleen:  Within normal limits in size and echotexture.

Right kidney:  Normal in size and parenchymal echogenicity.  No
evidence of mass or hydronephrosis.

Left kidney:  Normal in size and parenchymal echogenicity.  No
evidence of mass or hydronephrosis.

Abdominal Aorta:  No aneurysm identified.

No ascites is identified.
IMPRESSION: 1.  Cholelithiasis.  Numerous gallstones fill the gallbladder
lumen.
2.  The gallbladder wall thickness is normal.
3.  Question very mild micronodular contour the left lobe of the
liver.  Early / mild hepatic cirrhosis cannot be excluded.

## 2014-11-27 ENCOUNTER — Other Ambulatory Visit: Payer: Self-pay | Admitting: Family Medicine

## 2014-11-27 DIAGNOSIS — R4781 Slurred speech: Secondary | ICD-10-CM

## 2014-12-04 ENCOUNTER — Ambulatory Visit
Admission: RE | Admit: 2014-12-04 | Discharge: 2014-12-04 | Disposition: A | Discharge status: Home / Self Care | Payer: BLUE CROSS/BLUE SHIELD | Source: Ambulatory Visit | Attending: Family Medicine | Admitting: Family Medicine

## 2014-12-04 DIAGNOSIS — R4781 Slurred speech: Secondary | ICD-10-CM

## 2015-05-16 ENCOUNTER — Encounter (HOSPITAL_COMMUNITY): Payer: Self-pay

## 2016-05-28 IMAGING — US US CAROTID DUPLEX BILAT
1 series · 13 of 24 positions shown · non-contrast
Comparison: None.

CLINICAL DATA: Acute slurred speech, hypertension, smoker

EXAM:
BILATERAL CAROTID DUPLEX ULTRASOUND
TECHNIQUE: Gray scale imaging, color Doppler and duplex ultrasound were
performed of bilateral carotid and vertebral arteries in the neck.

[Series 1: us carotid duplex bilat · 0.07mm/px · 13 of 56 slices shown]
[im 1/56]
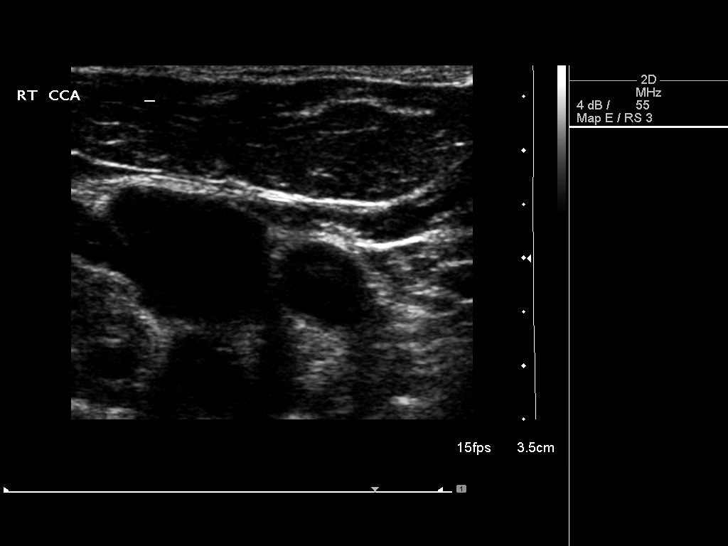
[im 5/56]
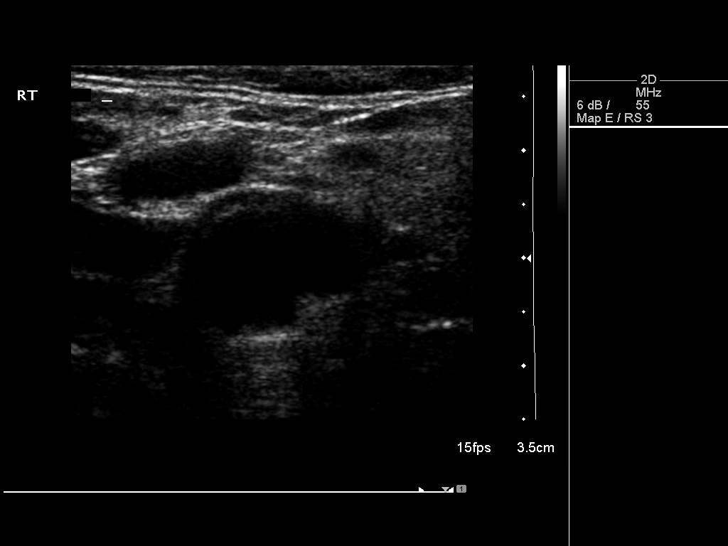
[im 10/56]
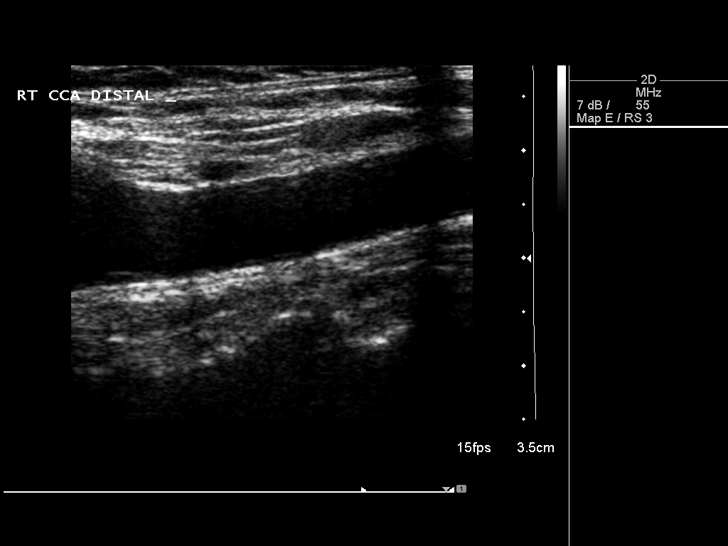
[im 15/56]
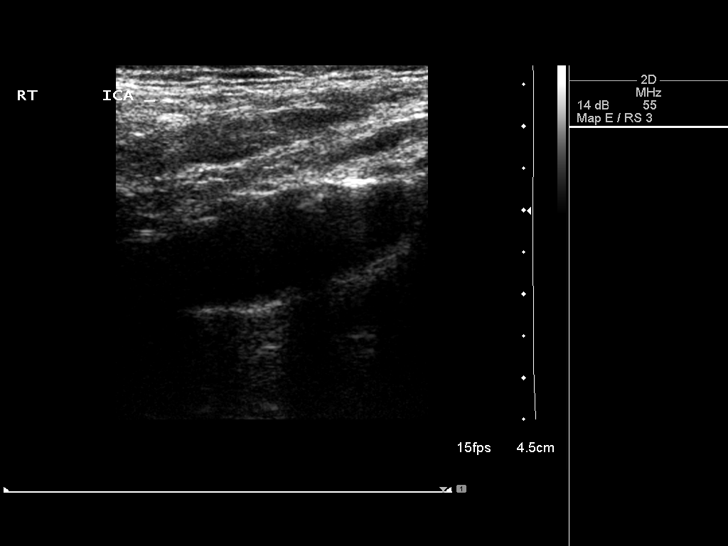
[im 20/56]
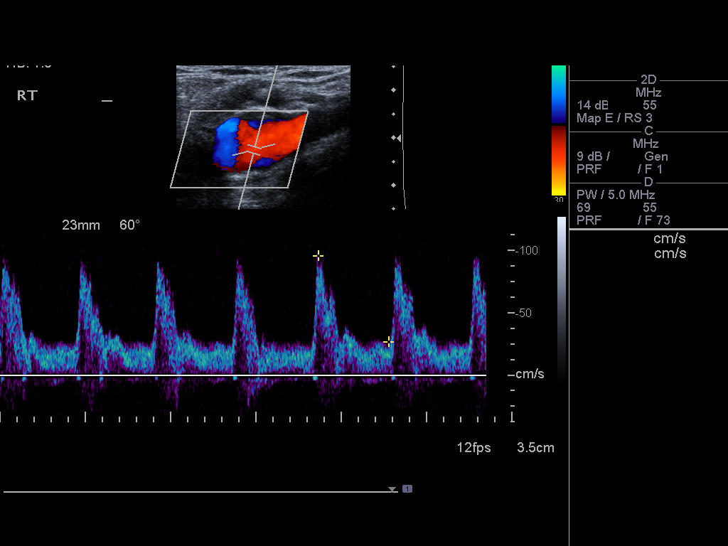
[im 24/56]
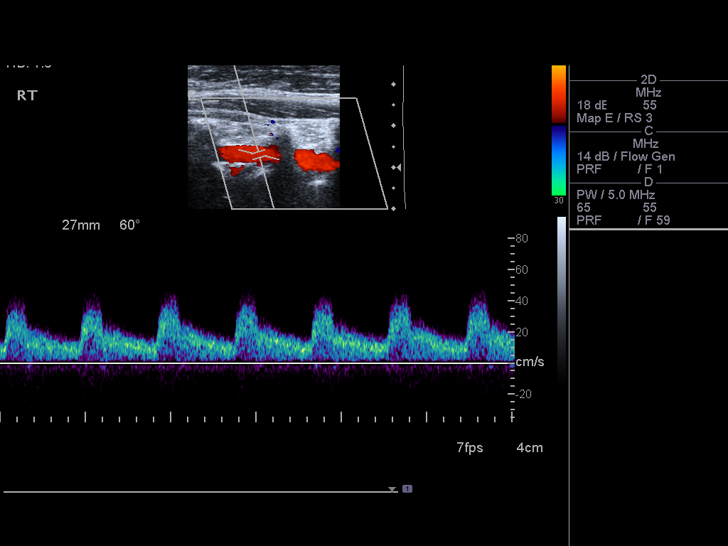
[im 29/56]
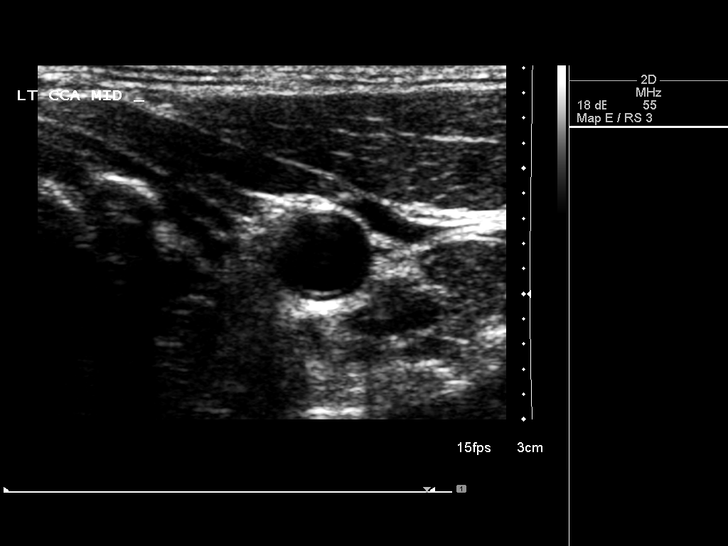
[im 32/56]
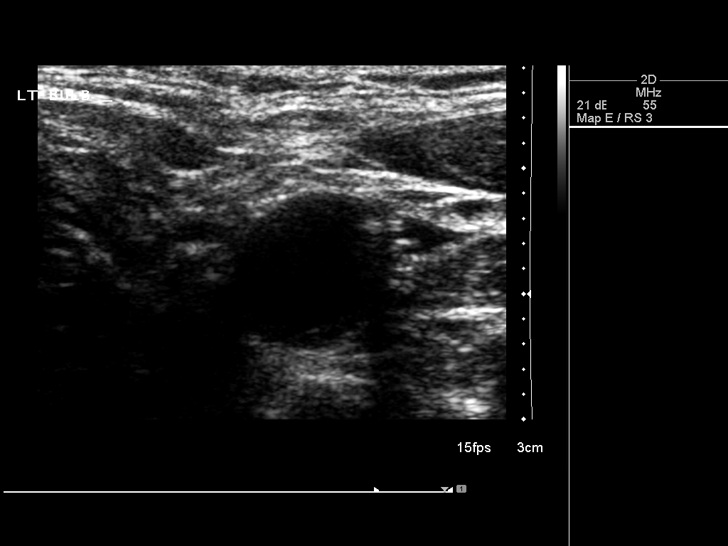
[im 36/56]
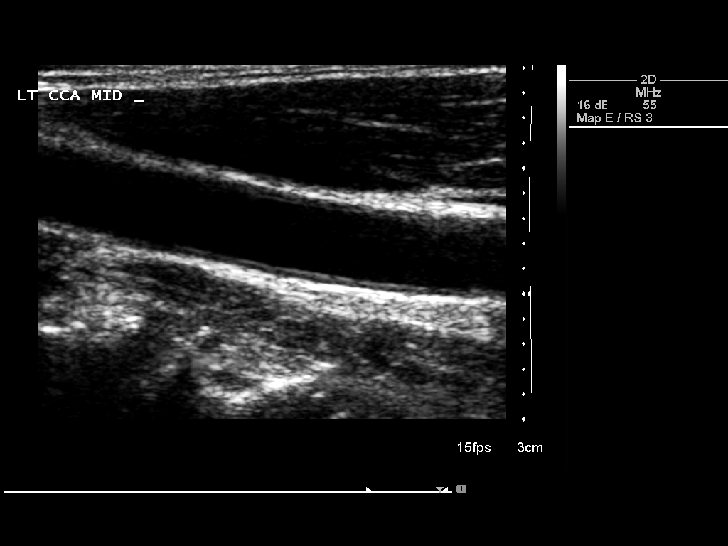
[im 41/56]
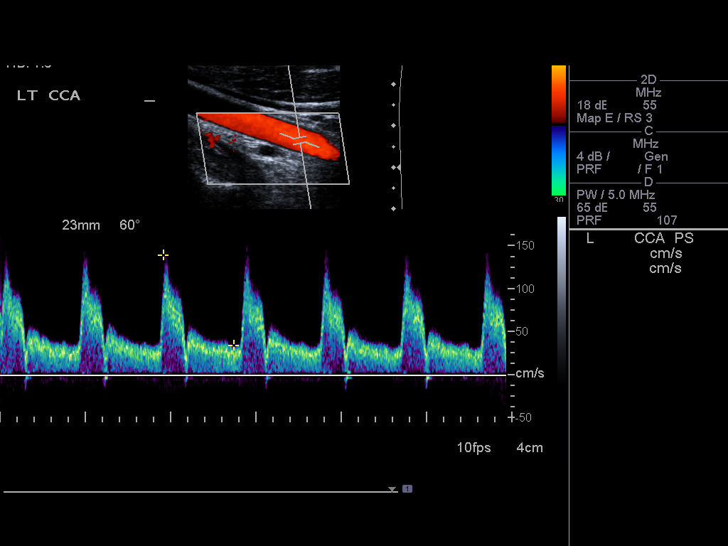
[im 46/56]
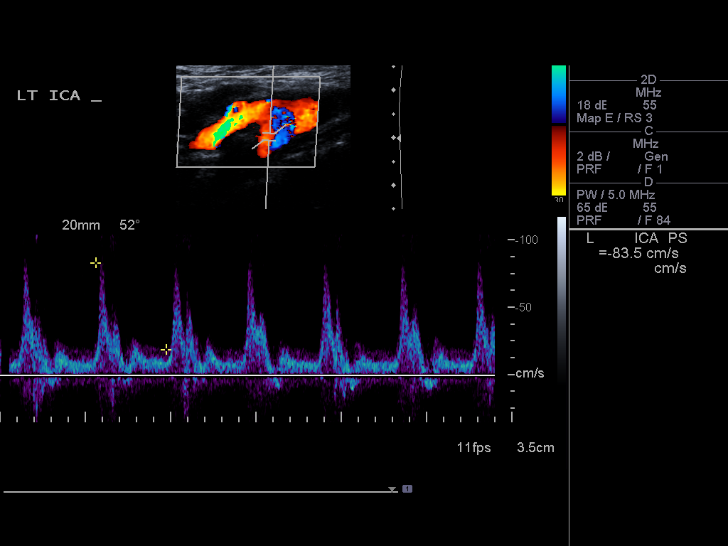
[im 51/56]
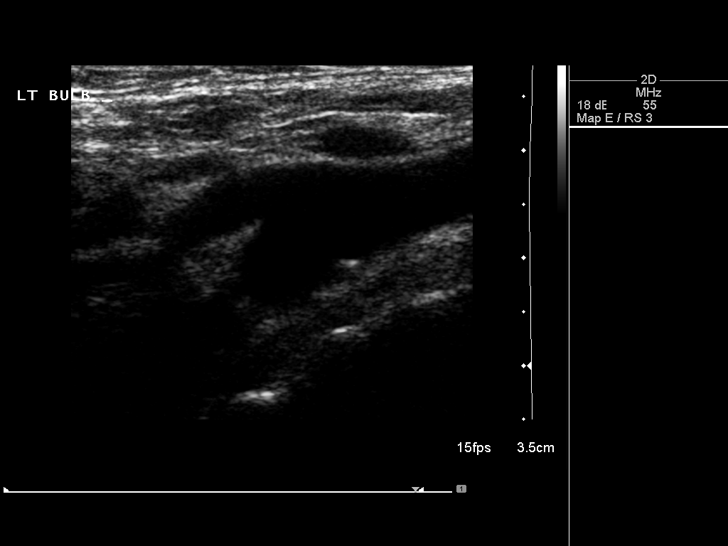
[im 56/56]
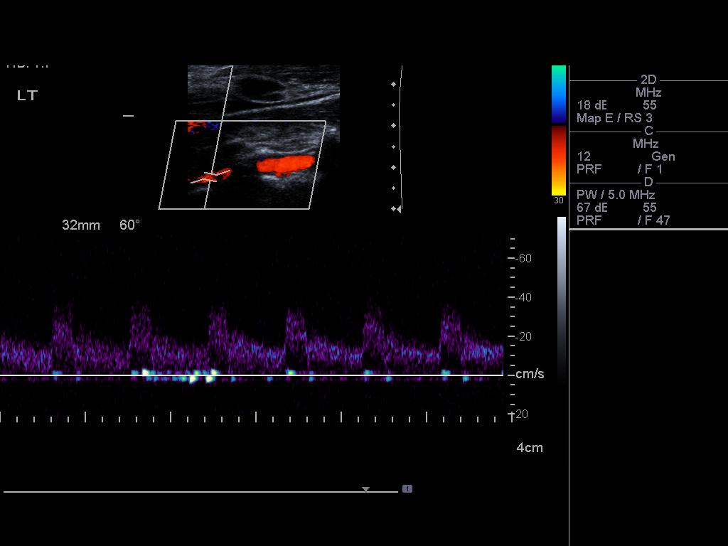

[13 of 24 positions shown; findings below may reference images not displayed]

FINDINGS: Criteria: Quantification of carotid stenosis is based on velocity
parameters that correlate the residual internal carotid diameter
with NASCET-based stenosis levels, using the diameter of the distal
internal carotid lumen as the denominator for stenosis measurement.

The following velocity measurements were obtained:

RIGHT

ICA:  72/32 cm/sec

CCA:  111/25 cm/sec

SYSTOLIC ICA/CCA RATIO:

DIASTOLIC ICA/CCA RATIO:

ECA:  84 cm/sec

LEFT

ICA:  84/19 cm/sec

CCA:  140/35 cm/sec

SYSTOLIC ICA/CCA RATIO:

DIASTOLIC ICA/CCA RATIO:

ECA:  104 cm/sec

RIGHT CAROTID ARTERY: Minor echogenic shadowing plaque formation. No
hemodynamically significant right ICA stenosis, velocity elevation,
or turbulent flow. Degree of narrowing less than 50%.

RIGHT VERTEBRAL ARTERY:  Antegrade

LEFT CAROTID ARTERY: Similar scattered minor echogenic plaque
formation. No hemodynamically significant left ICA stenosis,
velocity elevation, or turbulent flow.

LEFT VERTEBRAL ARTERY:  Antegrade
IMPRESSION: Minor carotid atherosclerosis. No hemodynamically significant ICA
stenosis. Degree of narrowing less than 50% bilaterally.

## 2022-11-20 ENCOUNTER — Inpatient Hospital Stay (HOSPITAL_COMMUNITY)
Admission: EM | Admit: 2022-11-20 | Discharge: 2022-11-25 | DRG: 699 | Disposition: A | Discharge status: Home / Self Care | Payer: Commercial Managed Care - PPO | Attending: Internal Medicine | Admitting: Internal Medicine

## 2022-11-20 ENCOUNTER — Encounter (HOSPITAL_COMMUNITY): Payer: Self-pay

## 2022-11-20 ENCOUNTER — Emergency Department (HOSPITAL_COMMUNITY): Payer: Commercial Managed Care - PPO

## 2022-11-20 ENCOUNTER — Other Ambulatory Visit: Payer: Self-pay

## 2022-11-20 DIAGNOSIS — R7401 Elevation of levels of liver transaminase levels: Secondary | ICD-10-CM | POA: Insufficient documentation

## 2022-11-20 DIAGNOSIS — I10 Essential (primary) hypertension: Secondary | ICD-10-CM

## 2022-11-20 DIAGNOSIS — K5730 Diverticulosis of large intestine without perforation or abscess without bleeding: Secondary | ICD-10-CM | POA: Diagnosis present

## 2022-11-20 DIAGNOSIS — R7989 Other specified abnormal findings of blood chemistry: Secondary | ICD-10-CM | POA: Diagnosis present

## 2022-11-20 DIAGNOSIS — Z8249 Family history of ischemic heart disease and other diseases of the circulatory system: Secondary | ICD-10-CM

## 2022-11-20 DIAGNOSIS — Z720 Tobacco use: Secondary | ICD-10-CM | POA: Diagnosis not present

## 2022-11-20 DIAGNOSIS — R109 Unspecified abdominal pain: Secondary | ICD-10-CM

## 2022-11-20 DIAGNOSIS — N179 Acute kidney failure, unspecified: Secondary | ICD-10-CM

## 2022-11-20 DIAGNOSIS — R319 Hematuria, unspecified: Secondary | ICD-10-CM | POA: Diagnosis present

## 2022-11-20 DIAGNOSIS — R079 Chest pain, unspecified: Secondary | ICD-10-CM

## 2022-11-20 DIAGNOSIS — D72829 Elevated white blood cell count, unspecified: Secondary | ICD-10-CM

## 2022-11-20 DIAGNOSIS — N280 Ischemia and infarction of kidney: Secondary | ICD-10-CM | POA: Diagnosis present

## 2022-11-20 DIAGNOSIS — Z833 Family history of diabetes mellitus: Secondary | ICD-10-CM

## 2022-11-20 DIAGNOSIS — J449 Chronic obstructive pulmonary disease, unspecified: Secondary | ICD-10-CM | POA: Diagnosis present

## 2022-11-20 DIAGNOSIS — E785 Hyperlipidemia, unspecified: Secondary | ICD-10-CM | POA: Diagnosis not present

## 2022-11-20 DIAGNOSIS — F17210 Nicotine dependence, cigarettes, uncomplicated: Secondary | ICD-10-CM | POA: Diagnosis present

## 2022-11-20 DIAGNOSIS — E860 Dehydration: Secondary | ICD-10-CM | POA: Diagnosis present

## 2022-11-20 DIAGNOSIS — I700 Atherosclerosis of aorta: Secondary | ICD-10-CM | POA: Diagnosis present

## 2022-11-20 DIAGNOSIS — Z79899 Other long term (current) drug therapy: Secondary | ICD-10-CM

## 2022-11-20 HISTORY — DX: Acute pancreatitis without necrosis or infection, unspecified: K85.90

## 2022-11-20 HISTORY — DX: Essential (primary) hypertension: I10

## 2022-11-20 LAB — ECHOCARDIOGRAM COMPLETE
AR max vel: 2.07 cm2
AV Area VTI: 2.05 cm2
AV Area mean vel: 1.88 cm2
AV Mean grad: 5 mmHg
AV Peak grad: 7.2 mmHg
Ao pk vel: 1.34 m/s
Area-P 1/2: 2.94 cm2
Calc EF: 62 %
Height: 71 in
S' Lateral: 4 cm
Single Plane A2C EF: 62.6 %
Single Plane A4C EF: 61.9 %
Weight: 2991.2 oz

## 2022-11-20 LAB — BASIC METABOLIC PANEL
Anion gap: 10 (ref 5–15)
BUN: 6 mg/dL (ref 6–20)
CO2: 25 mmol/L (ref 22–32)
Calcium: 9.4 mg/dL (ref 8.9–10.3)
Chloride: 100 mmol/L (ref 98–111)
Creatinine, Ser: 1.43 mg/dL — ABNORMAL HIGH (ref 0.61–1.24)
GFR, Estimated: 56 mL/min — ABNORMAL LOW (ref >60)
Glucose, Bld: 125 mg/dL — ABNORMAL HIGH (ref 70–99)
Potassium: 4.4 mmol/L (ref 3.5–5.1)
Sodium: 135 mmol/L (ref 135–145)

## 2022-11-20 LAB — HEPATIC FUNCTION PANEL
ALT: 26 U/L (ref 0–44)
AST: 40 U/L (ref 15–41)
Albumin: 4 g/dL (ref 3.5–5.0)
Alkaline Phosphatase: 59 U/L (ref 38–126)
Bilirubin, Direct: 0.5 mg/dL — ABNORMAL HIGH (ref 0.0–0.2)
Total Bilirubin: 1.4 mg/dL — ABNORMAL HIGH (ref 0.3–1.2)
Total Protein: 7.4 g/dL (ref 6.5–8.1)

## 2022-11-20 LAB — URINALYSIS, ROUTINE W REFLEX MICROSCOPIC
Bilirubin Urine: NEGATIVE
Glucose, UA: NEGATIVE mg/dL
Ketones, ur: 5 mg/dL — AB
Leukocytes,Ua: NEGATIVE
Nitrite: NEGATIVE
Protein, ur: 100 mg/dL — AB
Specific Gravity, Urine: 1.016 (ref 1.005–1.030)
pH: 5 (ref 5.0–8.0)

## 2022-11-20 LAB — HIV ANTIBODY (ROUTINE TESTING W REFLEX): HIV Screen 4th Generation wRfx: NONREACTIVE

## 2022-11-20 LAB — CBC
HCT: 52.1 % — ABNORMAL HIGH (ref 39.0–52.0)
Hemoglobin: 17.1 g/dL — ABNORMAL HIGH (ref 13.0–17.0)
MCH: 30.7 pg (ref 26.0–34.0)
MCHC: 32.8 g/dL (ref 30.0–36.0)
MCV: 93.5 fL (ref 80.0–100.0)
Platelets: 275 10*3/uL (ref 150–400)
RBC: 5.57 MIL/uL (ref 4.22–5.81)
RDW: 14.6 % (ref 11.5–15.5)
WBC: 9.6 10*3/uL (ref 4.0–10.5)
nRBC: 0 % (ref 0.0–0.2)

## 2022-11-20 LAB — ANTITHROMBIN III: AntiThromb III Func: 101 % (ref 75–120)

## 2022-11-20 LAB — LIPASE, BLOOD: Lipase: 31 U/L (ref 11–51)

## 2022-11-20 MED ORDER — ONDANSETRON HCL 4 MG/2ML IJ SOLN: 4.0000 mg | Freq: Four times a day (QID) | INTRAMUSCULAR | Status: DC | PRN

## 2022-11-20 MED ORDER — HEPARIN BOLUS VIA INFUSION
4000.0000 [IU] | Freq: Once | INTRAVENOUS | Status: AC
  Administered 2022-11-20: 4000 [IU] via INTRAVENOUS
  Filled 2022-11-20: qty 4000

## 2022-11-20 MED ORDER — SODIUM CHLORIDE 0.9 % IV BOLUS
1000.0000 mL | Freq: Once | INTRAVENOUS | Status: AC
  Administered 2022-11-20: 1000 mL via INTRAVENOUS

## 2022-11-20 MED ORDER — IOHEXOL 350 MG/ML SOLN
75.0000 mL | Freq: Once | INTRAVENOUS | Status: AC | PRN
  Administered 2022-11-20: 75 mL via INTRAVENOUS

## 2022-11-20 MED ORDER — AMLODIPINE BESYLATE 10 MG PO TABS
10.0000 mg | ORAL_TABLET | Freq: Every day | ORAL | Status: DC
  Administered 2022-11-20 – 2022-11-25 (×6): 10 mg via ORAL
  Filled 2022-11-20 (×6): qty 1

## 2022-11-20 MED ORDER — PANTOPRAZOLE SODIUM 40 MG PO TBEC
40.0000 mg | DELAYED_RELEASE_TABLET | Freq: Every day | ORAL | Status: DC
  Administered 2022-11-20: 40 mg via ORAL
  Filled 2022-11-20: qty 1

## 2022-11-20 MED ORDER — OXYCODONE HCL 5 MG PO TABS
5.0000 mg | ORAL_TABLET | ORAL | Status: DC | PRN
  Administered 2022-11-20 – 2022-11-21 (×3): 5 mg via ORAL
  Filled 2022-11-20 (×3): qty 1

## 2022-11-20 MED ORDER — ATORVASTATIN CALCIUM 40 MG PO TABS
40.0000 mg | ORAL_TABLET | Freq: Every day | ORAL | Status: DC
  Administered 2022-11-20: 40 mg via ORAL
  Filled 2022-11-20: qty 1

## 2022-11-20 MED ORDER — ACETAMINOPHEN 325 MG PO TABS
650.0000 mg | ORAL_TABLET | Freq: Four times a day (QID) | ORAL | Status: DC | PRN
  Administered 2022-11-20: 650 mg via ORAL
  Filled 2022-11-20: qty 2

## 2022-11-20 MED ORDER — SORBITOL 70 % SOLN: 30.0000 mL | Freq: Every day | Status: DC | PRN

## 2022-11-20 MED ORDER — ONDANSETRON HCL 4 MG/2ML IJ SOLN
4.0000 mg | Freq: Once | INTRAMUSCULAR | Status: AC
  Administered 2022-11-20: 4 mg via INTRAVENOUS
  Filled 2022-11-20: qty 2

## 2022-11-20 MED ORDER — HEPARIN (PORCINE) 25000 UT/250ML-% IV SOLN
1700.0000 [IU]/h | INTRAVENOUS | Status: DC
  Administered 2022-11-20 – 2022-11-21 (×2): 1300 [IU]/h via INTRAVENOUS
  Administered 2022-11-22: 1600 [IU]/h via INTRAVENOUS
  Administered 2022-11-23 – 2022-11-24 (×2): 1700 [IU]/h via INTRAVENOUS
  Filled 2022-11-20 (×6): qty 250

## 2022-11-20 MED ORDER — SODIUM CHLORIDE 0.9 % IV SOLN: INTRAVENOUS | Status: DC

## 2022-11-20 MED ORDER — HYDRALAZINE HCL 20 MG/ML IJ SOLN: 10.0000 mg | Freq: Four times a day (QID) | INTRAMUSCULAR | Status: DC | PRN

## 2022-11-20 MED ORDER — ACETAMINOPHEN 650 MG RE SUPP: 650.0000 mg | Freq: Four times a day (QID) | RECTAL | Status: DC | PRN

## 2022-11-20 MED ORDER — ONDANSETRON HCL 4 MG PO TABS: 4.0000 mg | ORAL_TABLET | Freq: Four times a day (QID) | ORAL | Status: DC | PRN

## 2022-11-20 MED ORDER — POLYETHYLENE GLYCOL 3350 17 G PO PACK: 17.0000 g | PACK | Freq: Every day | ORAL | Status: DC | PRN

## 2022-11-20 MED ORDER — MORPHINE SULFATE (PF) 4 MG/ML IV SOLN
4.0000 mg | Freq: Once | INTRAVENOUS | Status: AC
  Administered 2022-11-20: 4 mg via INTRAVENOUS
  Filled 2022-11-20: qty 1

## 2022-11-20 MED ORDER — IOHEXOL 350 MG/ML SOLN
100.0000 mL | Freq: Once | INTRAVENOUS | Status: AC | PRN
  Administered 2022-11-20: 100 mL via INTRAVENOUS

## 2022-11-20 MED ORDER — SODIUM CHLORIDE 0.9% FLUSH
3.0000 mL | Freq: Two times a day (BID) | INTRAVENOUS | Status: DC
  Administered 2022-11-21 – 2022-11-25 (×7): 3 mL via INTRAVENOUS

## 2022-11-20 NOTE — ED Provider Notes (Addendum)
[place] EMERGENCY DEPARTMENT AT [facility] Provider Note   CSN: [id number] Arrival date & time: 11/20/22  0818     History  Chief Complaint  Patient presents with   Back Pain   Nausea    Alfred Gray is a 57 y.o. male.  Patient with history of pancreatitis and hypertension presents today with complaints of back pain and nausea. He states that symptoms have been ongoing for the past 2 days. Endorses nausea without vomiting or diarrhea. He is having regular bowel movements. Pain feels similar to when he had pancreatitis 10 years ago which was thought to be due to gallstones. He does not drink alcohol. Denies history of abdominal surgeries. No fevers or chills. No urinary symptoms. He did not take his blood pressure meds this morning.  The history is provided by the patient. No language interpreter was used.  Back Pain Associated symptoms: abdominal pain        Home Medications Prior to Admission medications   Medication Sig Start Date End Date Taking? Authorizing Provider  Docusate Sodium (DSS) 100 MG CAPS Take 100 mg by mouth 2 (two) times daily. 05/18/12   Street, Stephanie Coup, MD  ondansetron (ZOFRAN) 4 MG tablet Take 1 tablet (4 mg total) by mouth every 6 (six) hours as needed for nausea. 05/18/12   Street, Stephanie Coup, MD  oxyCODONE-acetaminophen (PERCOCET/ROXICET) 5-325 MG per tablet Take 1 tablet by mouth every 6 (six) hours as needed. 05/18/12   Street, Stephanie Coup, MD      Allergies    Patient has no known allergies.    Review of Systems   Review of Systems  Gastrointestinal:  Positive for abdominal pain and nausea.  Musculoskeletal:  Positive for back pain.  All other systems reviewed and are negative.   Physical Exam Updated Vital Signs BP (!) 163/112   Pulse 83   Temp 97.9 F (36.6 C) (Oral)   Resp 16   Ht 5\' 11"  (1.803 m)   Wt 84.8 kg   SpO2 100%   BMI 26.07 kg/m  Physical Exam Vitals and nursing note reviewed.  Constitutional:       General: He is not in acute distress.    Appearance: Normal appearance. He is normal weight. He is not ill-appearing, toxic-appearing or diaphoretic.  HENT:     Head: Normocephalic and atraumatic.  Cardiovascular:     Rate and Rhythm: Normal rate and regular rhythm.     Heart sounds: Normal heart sounds.  Pulmonary:     Effort: Pulmonary effort is normal. No respiratory distress.     Breath sounds: Normal breath sounds.  Abdominal:     General: Abdomen is flat.     Palpations: Abdomen is soft.     Tenderness: There is abdominal tenderness.     Comments: Tenderness to palpation of bilateral flanks.  No Cullens or Grey Turner sign. LLQ TTP  Musculoskeletal:        General: Normal range of motion.     Cervical back: Normal range of motion.     Comments: No tenderness to palpation of cervical, thoracic, or lumbar spine.  Skin:    General: Skin is warm and dry.  Neurological:     General: No focal deficit present.     Mental Status: He is alert.  Psychiatric:        Mood and Affect: Mood normal.        Behavior: Behavior normal.     ED Results / Procedures /  Treatments   Labs (all labs ordered are listed, but only abnormal results are displayed) Labs Reviewed  BASIC METABOLIC PANEL - Abnormal; Notable for the following components:      Result Value   Glucose, Bld 125 (*)    Creatinine, Ser 1.43 (*)    GFR, Estimated 56 (*)    All other components within normal limits  CBC - Abnormal; Notable for the following components:   Hemoglobin 17.1 (*)    HCT 52.1 (*)    All other components within normal limits  URINALYSIS, ROUTINE W REFLEX MICROSCOPIC  LIPASE, BLOOD  HEPATIC FUNCTION PANEL    EKG EKG Interpretation  Date/Time:  Thursday November 20 2022 14:09:34 EDT Ventricular Rate:  67 PR Interval:  192 QRS Duration: 97 QT Interval:  402 QTC Calculation: 425 R Axis:   46 Text Interpretation: Sinus rhythm Probable left atrial enlargement Left ventricular hypertrophy  Nonspecific T abnormalities, inferior leads ST elevation, consider anterior injury No previous tracing Confirmed by Gwyneth Sprout ([id number]) on 11/20/2022 2:27:04 PM  Radiology ECHOCARDIOGRAM COMPLETE  Result Date: 11/20/2022    ECHOCARDIOGRAM REPORT   Patient Name:   Alfred Gray Date of Exam: 11/20/2022 Medical Rec #:  [id number]        Height:       71.0 in Accession #:    [id number]       Weight:       186.9 lb Date of Birth:  Dec 24, 1965         BSA:          2.049 m Patient Age:    57 years         BP:           140/98 mmHg Patient Gender: M                HR:           61 bpm. Exam Location:  Inpatient Procedure: 2D Echo, Cardiac Doppler and Color Doppler Indications:    Chest pain  History:        Patient has no prior history of Echocardiogram examinations.                 Signs/Symptoms:Chest Pain; Risk Factors:Current Smoker,                 Dyslipidemia and Hypertension.  Sonographer:    Dondra Prader RVT RCS Referring Phys: [id number] [facility] A Rimas Gilham IMPRESSIONS  1. Left ventricular ejection fraction, by estimation, is 55 to 60%. The left ventricle has normal function. The left ventricle has no regional wall motion abnormalities. There is mild concentric left ventricular hypertrophy. Left ventricular diastolic parameters are consistent with Grade I diastolic dysfunction (impaired relaxation).  2. Right ventricular systolic function is normal. The right ventricular size is normal. Tricuspid regurgitation signal is inadequate for assessing PA pressure.  3. The mitral valve is normal in structure. No evidence of mitral valve regurgitation. No evidence of mitral stenosis.  4. The aortic valve is tricuspid. There is mild calcification of the aortic valve. Aortic valve regurgitation is not visualized. Aortic valve sclerosis/calcification is present, without any evidence of aortic stenosis. Aortic valve area, by VTI measures  2.05 cm. Aortic valve mean gradient measures 5.0 mmHg. Aortic valve Vmax measures 1.34  m/s.  5. The inferior vena cava is normal in size with greater than 50% respiratory variability, suggesting right atrial pressure of 3 mmHg. FINDINGS  Left Ventricle: Left ventricular ejection fraction, by estimation, is  55 to 60%. The left ventricle has normal function. The left ventricle has no regional wall motion abnormalities. The left ventricular internal cavity size was normal in size. There is  mild concentric left ventricular hypertrophy. Left ventricular diastolic parameters are consistent with Grade I diastolic dysfunction (impaired relaxation). Normal left ventricular filling pressure. Right Ventricle: The right ventricular size is normal. No increase in right ventricular wall thickness. Right ventricular systolic function is normal. Tricuspid regurgitation signal is inadequate for assessing PA pressure. Left Atrium: Left atrial size was normal in size. Right Atrium: Right atrial size was normal in size. Pericardium: There is no evidence of pericardial effusion. Mitral Valve: The mitral valve is normal in structure. No evidence of mitral valve regurgitation. No evidence of mitral valve stenosis. Tricuspid Valve: The tricuspid valve is normal in structure. Tricuspid valve regurgitation is not demonstrated. No evidence of tricuspid stenosis. Aortic Valve: The aortic valve is tricuspid. There is mild calcification of the aortic valve. Aortic valve regurgitation is not visualized. Aortic valve sclerosis/calcification is present, without any evidence of aortic stenosis. Aortic valve mean gradient measures 5.0 mmHg. Aortic valve peak gradient measures 7.2 mmHg. Aortic valve area, by VTI measures 2.05 cm. Pulmonic Valve: The pulmonic valve was normal in structure. Pulmonic valve regurgitation is trivial. No evidence of pulmonic stenosis. Aorta: The aortic root is normal in size and structure. Venous: The inferior vena cava is normal in size with greater than 50% respiratory variability, suggesting right atrial  pressure of 3 mmHg. IAS/Shunts: No atrial level shunt detected by color flow Doppler.  LEFT VENTRICLE PLAX 2D LVIDd:         5.20 cm      Diastology LVIDs:         4.00 cm      LV e' medial:    3.64 cm/s LV PW:         1.30 cm      LV E/e' medial:  9.9 LV IVS:        1.30 cm      LV e' lateral:   5.43 cm/s LVOT diam:     2.00 cm      LV E/e' lateral: 6.6 LV SV:         61 LV SV Index:   30 LVOT Area:     3.14 cm  LV Volumes (MOD) LV vol d, MOD A2C: 193.0 ml LV vol d, MOD A4C: 199.0 ml LV vol s, MOD A2C: 72.2 ml LV vol s, MOD A4C: 75.8 ml LV SV MOD A2C:     120.8 ml LV SV MOD A4C:     199.0 ml LV SV MOD BP:      122.7 ml RIGHT VENTRICLE             IVC RV Basal diam:  3.60 cm     IVC diam: 1.60 cm RV S prime:     12.90 cm/s TAPSE (M-mode): 2.4 cm LEFT ATRIUM             Index        RIGHT ATRIUM           Index LA diam:        3.80 cm 1.85 cm/m   RA Area:     15.50 cm LA Vol (A2C):   69.2 ml 33.77 ml/m  RA Volume:   40.50 ml  19.77 ml/m LA Vol (A4C):   47.5 ml 23.18 ml/m LA Biplane Vol: 60.7 ml 29.62 ml/m  AORTIC  VALVE                     PULMONIC VALVE AV Area (Vmax):    2.07 cm      PV Vmax:          0.87 m/s AV Area (Vmean):   1.88 cm      PV Peak grad:     3.1 mmHg AV Area (VTI):     2.05 cm      PR End Diast Vel: 1.43 msec AV Vmax:           134.00 cm/s AV Vmean:          105.000 cm/s AV VTI:            0.298 m AV Peak Grad:      7.2 mmHg AV Mean Grad:      5.0 mmHg LVOT Vmax:         88.30 cm/s LVOT Vmean:        62.800 cm/s LVOT VTI:          0.194 m LVOT/AV VTI ratio: 0.65  AORTA Ao Root diam: 3.40 cm Ao Asc diam:  3.30 cm Ao Arch diam: 2.7 cm MITRAL VALVE MV Area (PHT): 2.94 cm    SHUNTS MV Decel Time: 258 msec    Systemic VTI:  0.19 m MV E velocity: 36.00 cm/s  Systemic Diam: 2.00 cm MV A velocity: 74.40 cm/s MV E/A ratio:  0.48 Armanda Magic MD Electronically signed by Armanda Magic MD Signature Date/Time: 11/20/2022/4:40:03 PM    Final    CT Angio Chest/Abd/Pel for Dissection W and/or Wo  Contrast  Result Date: 11/20/2022 CLINICAL DATA:  Arterial embolism suspected, non-extremity, determine source. EXAM: CT ANGIOGRAPHY CHEST, ABDOMEN AND PELVIS TECHNIQUE: Non-contrast CT of the chest was initially obtained. Multidetector CT imaging through the chest, abdomen and pelvis was performed using the standard protocol during bolus administration of intravenous contrast. Multiplanar reconstructed images and MIPs were obtained and reviewed to evaluate the vascular anatomy. RADIATION DOSE REDUCTION: This exam was performed according to the departmental dose-optimization program which includes automated exposure control, adjustment of the mA and/or kV according to patient size and/or use of iterative reconstruction technique. CONTRAST:  OMNIPAQUE IOHEXOL 350 MG/ML SOLN COMPARISON:  CT abdomen and pelvis 11/20/2022 FINDINGS: CTA CHEST FINDINGS Cardiovascular: There is no evidence of a thoracic aortic intramural hematoma on noncontrast images. There is mild thoracic aortic atherosclerosis without evidence of a dissection or aneurysm. There is a common origin of the brachiocephalic and left common carotid arteries, a normal variant. No pulmonary arterial emboli are identified on this nondedicated study. The heart is borderline enlarged. There is no pericardial effusion. Mediastinum/Nodes: No enlarged axillary, mediastinal, or hilar lymph nodes. Unremarkable thyroid and esophagus. Lungs/Pleura: No pleural effusion or pneumothorax. No consolidation or suspicious nodule. Suspected small focus of mucous near the right mainstem bronchus orifice. Musculoskeletal: No acute osseous abnormality or suspicious osseous lesion. Review of the MIP images confirms the above findings. CTA ABDOMEN AND PELVIS FINDINGS VASCULAR Aorta: Mild soft and calcified plaque, predominantly in the infrarenal aorta. No dissection, aneurysm, or significant stenosis. Celiac: Patent without evidence of aneurysm, dissection, vasculitis or  significant stenosis. SMA: Patent without evidence of aneurysm, dissection, vasculitis or significant stenosis. Renals: Both renal arteries are patent proximally without evidence of aneurysm, dissection, or fibromuscular dysplasia. There is occlusion of multiple small, distal left renal artery branch vessels. IMA: Patent without evidence of aneurysm, dissection, vasculitis or significant  stenosis. Inflow: Moderate atherosclerosis without evidence of a significant stenosis, dissection, or aneurysm. Veins: No obvious venous abnormality within the limitations of this arterial phase study. Review of the MIP images confirms the above findings. NON-VASCULAR Hepatobiliary: Multiple small hypodense liver lesions with the largest measuring 1.5 cm and likely reflective of a cyst and with the majority of the other lesions being subcentimeter in size and too small to fully characterize. Unremarkable gallbladder. No biliary dilatation. Pancreas: Unremarkable. Spleen: Unremarkable. Adrenals/Urinary Tract: Unremarkable adrenal glands. Small focus of cortical scarring laterally in the mid inferior right kidney. Multiple large wedge-shaped regions of absent enhancement in the left kidney, similar to today's earlier CT. No hydronephrosis. Excreted contrast from today's earlier CT in the right renal collecting system, right ureter, and bladder. Stomach/Bowel: The stomach is grossly unremarkable. There is extensive left-sided colonic diverticulosis without evidence of acute diverticulitis. There is no evidence of bowel obstruction. The appendix is unremarkable. Lymphatic: No enlarged lymph nodes in the abdomen or pelvis. Reproductive: Unremarkable prostate. Other: No ascites or pneumoperitoneum. Musculoskeletal: No acute osseous abnormality or suspicious osseous lesion. Review of the MIP images confirms the above findings. IMPRESSION: 1. 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 Wide  patency of the proximal left renal artery. 2. Aortic atherosclerosis without dissection or aneurysm. 3. Colonic diverticulosis. Aortic Atherosclerosis (ICD10-I70.0). Electronically Signed   By: Sebastian Ache M.D.   On: 11/20/2022 15:58   CT ABDOMEN PELVIS W CONTRAST  Result Date: 11/20/2022 CLINICAL DATA:  Abdominal pain EXAM: CT ABDOMEN AND PELVIS WITH CONTRAST TECHNIQUE: Multidetector CT imaging of the abdomen and pelvis was performed using the standard protocol following bolus administration of intravenous contrast. RADIATION DOSE REDUCTION: This exam was performed according to the departmental dose-optimization program which includes automated exposure control, adjustment of the mA and/or kV according to patient size and/or use of iterative reconstruction technique. CONTRAST:  36mL OMNIPAQUE IOHEXOL 350 MG/ML SOLN COMPARISON:  None Available. FINDINGS: Lower chest: No focal infiltrates are seen in lower lung fields. Hepatobiliary: There are a few low-density foci in liver lesion measuring less than 1.5 cm. There is no dilation of bile ducts. Gallbladder is not distended. Pancreas: No focal abnormalities are seen. Spleen: Unremarkable. Adrenals/Urinary Tract: Adrenals are unremarkable. There is no hydronephrosis. There are multiple large areas of decreased enhancement in left kidney. There is no significant perinephric stranding. As far as seen, main left renal artery is patent. Left atrium and left ventricle are not included in their entirety for evaluation. There are no renal or ureteral stones. There is focal cortical thinning in the lateral aspect of lower pole of right kidney. Ureters are not dilated. Urinary bladder is unremarkable. Stomach/Bowel: Stomach is not distended. There is mild diffuse mucosal enhancement in stomach. Small bowel loops are not dilated. Appendix is not dilated. There is no significant wall thickening in colon. Scattered diverticula are seen in colon without signs of focal acute  diverticulitis. Vascular/Lymphatic: There are scattered atherosclerotic plaques and calcifications in aorta and its major branches. Reproductive: There are coarse calcifications in prostate. Other: There is no ascites or pneumoperitoneum. Umbilical hernia containing fat is seen. Musculoskeletal: No acute findings are seen. Degenerative changes are noted in lumbar spine, more so at the L4-L5 and L5-S1 levels with encroachment of neural foramina. IMPRESSION: There are multiple patchy foci of decreased or absent perfusion in the left kidney. This may suggest acute pyelonephritis. Another possibility would be multiple emboli in peripheral left renal artery branches from central  cardiac source. Please correlate with clinical and laboratory findings. If there is clinical suspicion for arterial emboli arising from the heart, echocardiogram may be considered. There is no evidence of intestinal obstruction or pneumoperitoneum. Appendix is not dilated. There is no hydronephrosis. There is mild diffuse mucosal enhancement in the stomach which may be an artifact caused by incomplete distention although suggest gastritis. There are multiple small low-density foci in liver, possibly cysts or hemangiomas. Diverticulosis of colon without signs of focal acute diverticulitis. Electronically Signed   By: Ernie AvenaPalani  Rathinasamy M.D.   On: 11/20/2022 13:40    Procedures .Critical Care  Performed by: Silva BandySmoot, Cora Brierley A, PA-C Authorized by: Silva BandySmoot, Marshawn Ninneman A, PA-C   Critical care provider statement:    Critical care time (minutes):  30   Critical care start time:  11/20/2022 1:00 PM   Critical care end time:  11/20/2022 1:30 PM   Critical care was necessary to treat or prevent imminent or life-threatening deterioration of the following conditions:  Circulatory failure (renal infarct with AKI requiring heparin drip)   Critical care was time spent personally by me on the following activities:  Development of treatment plan with patient or  surrogate, discussions with consultants, discussions with primary provider, evaluation of patient's response to treatment, examination of patient, interpretation of cardiac output measurements, obtaining history from patient or surrogate, ordering and review of laboratory studies, ordering and review of radiographic studies, re-evaluation of patient's condition, pulse oximetry and review of old charts   Care discussed with: admitting provider       Medications Ordered in ED Medications  sodium chloride 0.9 % bolus 1,000 mL (1,000 mLs Intravenous New Bag/Given 11/20/22 1220)  morphine (PF) 4 MG/ML injection 4 mg (4 mg Intravenous Given 11/20/22 1220)  ondansetron (ZOFRAN) injection 4 mg (4 mg Intravenous Given 11/20/22 1235)    ED Course/ Medical Decision Making/ A&P                             Medical Decision Making Amount and/or Complexity of Data Reviewed Labs: ordered. Radiology: ordered.  Risk Prescription drug management. Decision regarding hospitalization.   This patient is a 57 y.o. male who presents to the ED for concern of back pain, this involves an extensive number of treatment options, and is a complaint that carries with it a high risk of complications and morbidity. The emergent differential diagnosis prior to evaluation includes, but is not limited to,  pancreatitis, pyelonephritis, kidney stone,   . This is not an exhaustive differential.   Past Medical History / Co-morbidities / Social History: Hx pancreatitis, hypertension  Physical Exam: Physical exam performed. The pertinent findings include: bilateral flank TTP  Lab Tests: I ordered, and personally interpreted labs.  The pertinent results include:  Creatinine 1.43, last reading WNL from 10 years ago. UA noninfectious. Lipase WNL.   Imaging Studies: I ordered imaging studies including CT abdomen pelvis, CTA chest abdomen pelvis. I independently visualized and interpreted imaging which showed   CT abdomen  pelvis:   There are multiple patchy foci of decreased or absent perfusion in the left kidney. This may suggest acute pyelonephritis. Another possibility would be multiple emboli in peripheral left renal artery branches from central cardiac source. Please correlate with clinical and laboratory findings. If there is clinical suspicion for arterial emboli arising from the heart, echocardiogram may be considered.   There is mild diffuse mucosal enhancement in the stomach which may be an artifact  caused by incomplete distention although suggest gastritis. There are multiple small low-density foci in liver, possibly cysts or hemangiomas.   CTA:   1. 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 Wide patency of the proximal left renal artery. 2. Aortic atherosclerosis without dissection or aneurysm. 3. Colonic diverticulosis.  Echo: EF 55-60%, LVH present, no signs of thrombus  I agree with the radiologist interpretation.   Cardiac Monitoring:  The patient was maintained on a cardiac monitor.  My attending physician Dr. Anitra Lauth viewed and interpreted the cardiac monitored which showed an underlying rhythm of: no STEMI. I agree with this interpretation.   Medications: I ordered medication including morphine, zofran, fluids  for pain, nausea, dehydration. Reevaluation of the patient after these medicines showed that the patient improved. I have reviewed the patients home medicines and have made adjustments as needed.    Disposition: After consideration of the diagnostic results and the patients response to treatment, I feel that admission for further evaluation and management of patients renal infarct. Patient understanding and in agreement.   Discussed patient with hospitalist who agrees to admit.   I discussed this case with my attending physician Dr. Anitra Lauth who cosigned this note including patient's presenting symptoms, physical exam, and  planned diagnostics and interventions. Attending physician stated agreement with plan or made changes to plan which were implemented.     Final Clinical Impression(s) / ED Diagnoses Final diagnoses:  Renal infarct  Acute left flank pain    Rx / DC Orders ED Discharge Orders     None         Vear Clock 11/20/22 1859    Eluzer Howdeshell, Shawn Route, PA-C 11/22/22 0801    Gwyneth Sprout, MD 11/22/22 [id number]

## 2022-11-20 NOTE — Progress Notes (Signed)
Pt arrived to unit from ed , A/O x 4,   pt oriented to unit,Will continue to monitor.   Karna Christmas Ariza Evans, RN    11/20/22 1821  Vitals  Temp 99 F (37.2 C)  Temp Source Oral  BP (!) 175/99  MAP (mmHg) 118  BP Location Right Arm  BP Method Automatic  Patient Position (if appropriate) Sitting  Pulse Rate 70  Pulse Rate Source Monitor  ECG Heart Rate 73  Resp 14  Level of Consciousness  Level of Consciousness Alert  Oxygen Therapy  SpO2 100 %  O2 Device Room Air  Pain Assessment  Pain Scale 0-10  Pain Score 7  Pain Location Abdomen  Pain Orientation Left;Lower  MEWS Score  MEWS Temp 0  MEWS Systolic 0  MEWS Pulse 0  MEWS RR 0  MEWS LOC 0  MEWS Score 0  MEWS Score Color [place] Si

## 2022-11-20 NOTE — ED Notes (Signed)
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 Pt says his symptoms feels like pancreatitis which he had 12 years ago.

## 2022-11-20 NOTE — Progress Notes (Signed)
ANTICOAGULATION CONSULT NOTE - Initial Consult  Pharmacy Consult for Heparin Indication:  renal infarcts  No Known Allergies  Patient Measurements: Height: 5\' 11"  (180.3 cm) Weight: 84.8 kg (186 lb 15.2 oz) IBW/kg (Calculated) : 75.3 Heparin Dosing Weight: 85 kg  Vital Signs: Temp: 99 F (37.2 C) (04/11 1821) Temp Source: Oral (04/11 1821) BP: 175/99 (04/11 1821) Pulse Rate: 70 (04/11 1821)  Labs: Recent Labs    11/20/22 0836  HGB 17.1*  HCT 52.1*  PLT 275  CREATININE 1.43*    Estimated Creatinine Clearance: 59.2 mL/min (A) (by C-G formula based on SCr of 1.43 mg/dL (H)).   Medical History: Past Medical History:  Diagnosis Date   HTN (hypertension) 11/20/2022   Pancreatitis     Medications:  Medications Prior to Admission  Medication Sig Dispense Refill Last Dose   ondansetron (ZOFRAN) 4 MG tablet Take 1 tablet (4 mg total) by mouth every 6 (six) hours as needed for nausea. 20 tablet 0 11/20/2022   Docusate Sodium (DSS) 100 MG CAPS Take 100 mg by mouth 2 (two) times daily. (Patient not taking: Reported on 11/20/2022) 14 each 0 Not Taking   oxyCODONE-acetaminophen (PERCOCET/ROXICET) 5-325 MG per tablet Take 1 tablet by mouth every 6 (six) hours as needed. (Patient not taking: Reported on 11/20/2022) 30 tablet 0 Not Taking    Assessment: 57 y.o. M presents with flank pain and nausea. 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 Hypercoagulable labs drawn. To begin heparin. CBC ok on admission. No AC PTA.  Goal of Therapy:  Heparin level 0.3-0.7 units/ml Monitor platelets by anticoagulation protocol: Yes   Plan:  Heparin IV bolus 4000 units Heparin gtt at 1300 units/hr Will f/u heparin level in 6 hours Daily heparin level and CBC  Christoper Fabian, PharmD, BCPS Please see amion for complete clinical pharmacist phone list 11/20/2022,7:29 PM

## 2022-11-20 NOTE — H&P (Addendum)
History and Physical    NATTHEW GEBRE FGB:[id number] DOB: November 20, 1965 DOA: 11/20/2022  PCP: Pcp, No  Patient coming from: Home  I have personally briefly reviewed patient's old medical records in [facility]  Chief Complaint: Left back pain/nausea  HPI: Alfred Gray is a 57 y.o. male with medical history significant of ongoing tobacco use, hypertension, hyperlipidemia presented to the ED with a 2 to 3-day history of left flank pain and nausea.  Patient noted to have had a bout of emesis on the day of admission which was nonbloody.  Patient denies any fevers, no chills, no chest pain, no shortness of breath, no syncope, no lightheadedness, no abdominal pain, no diarrhea, no constipation, no melena, no hematemesis, no hematochezia.  No lightheadedness.  No dizziness.  Denies any dysuria.  Denies any significant decrease in urine output.  ED Course: Patient seen in the ED, noted to have systolic blood pressures in the 170s, afebrile, sats of 100% on room air.  Basic metabolic profile with a glucose of 125, creatinine of 1.43 otherwise within normal limits.  Hepatic panel with a direct bilirubin of 0.5, total bilirubin of 1.4 otherwise within normal limits.  CBC with hemoglobin of 17.1 otherwise within normal limits.  Urinalysis done hazy, small hemoglobin, 5 ketones, nitrite negative, leukocytes negative, rare bacteria, 0-5 WBCs.  CT abdomen and pelvis done 11/20/2022 with multiple patchy foci of decreased PERFUSION IN THE LEFT KIDNEY.  MAY SUGGEST ACUTE PYELONEPHRITIS.  ANOTHER POSSIBILITY WOULD BE MULTIPLE EMBOLI IN THE PERIPHERAL LEFT RENAL ARTERY BRANCHES FROM CENTRAL CARDIAC SOURCE.  ECHO MAY BE CONSIDERED.  NO EVIDENCE OF INTESTINAL OBSTRUCTION OR PNEUMOPERITONEUM.  APPENDIX IS NOT DILATED.  NO HYDRONEPHROSIS.  MILD DIFFUSE MUCOSAL ENHANCEMENT IN THE STOMACH WHICH MAY BE AN ARTIFACT CAUSED BY INCOMPLETE DISTENTION ALTHOUGH SUGGESTS GASTRITIS.  MULTIPLE SMALL LOW-DENSITY FOCI IN THE LIVER  POSSIBLY CYSTS OR HEMANGIOMAS.  DIVERTICULOSIS OF THE COLON WITHOUT SIGNS OF FOCAL ACUTE DIVERTICULITIS. 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  Aortic atherosclerosis without dissection or aneurysm.  Colonic diverticulosis. 2D echo done with a EF of 55 to 60%,NWMA, grade 1 diastolic dysfunction.  No significant emboli noted on 2D echo.  Hospitalist called to admit the patient for further evaluation and management.   Review of Systems: As per HPI otherwise all other systems reviewed and are negative.  Past Medical History:  Diagnosis Date   HTN (hypertension) 11/20/2022   Pancreatitis     Past Surgical History:  Procedure Laterality Date   WRIST SURGERY     WRIST SURGERY      Social History  reports that he has been smoking cigarettes. He has been smoking an average of .3 packs per day. He does not have any smokeless tobacco history on file. He reports current drug use. Frequency: 14.00 times per week. Drug: Marijuana. He reports that he does not drink alcohol.  No Known Allergies  Family History  Problem Relation Age of Onset   Diabetes Mother    Diabetes Father    Dementia Father    Hypertension Other        Various family members   Mother alive age 68 with diabetes.  Father alive age 77 with diabetes and recently diagnosed dementia per patient.  Prior to Admission medications   Medication Sig Start Date End Date Taking? Authorizing Provider  ondansetron (ZOFRAN) 4 MG tablet Take 1  tablet (4 mg total) by mouth every 6 (six) hours as needed for nausea. 05/18/12  Yes Street, Stephanie Couphristopher M, MD  Docusate Sodium (DSS) 100 MG CAPS Take 100 mg by mouth 2 (two) times daily. Patient not taking: Reported on 11/20/2022 05/18/12   Street, Stephanie Couphristopher M, MD  oxyCODONE-acetaminophen (PERCOCET/ROXICET) 5-325 MG per tablet Take 1 tablet by mouth every 6 (six) hours as  needed. Patient not taking: Reported on 11/20/2022 05/18/12   Street, Stephanie Couphristopher M, MD    Physical Exam: Vitals:   11/20/22 1530 11/20/22 1537 11/20/22 1800 11/20/22 1821  BP:   (!) 148/97 (!) 175/99  Pulse: 80   70  Resp: 16   14  Temp:  (!) 97.3 F (36.3 C)  99 F (37.2 C)  TempSrc:    Oral  SpO2: 100%   100%  Weight:      Height:        Constitutional: NAD, calm, comfortable Vitals:   11/20/22 1530 11/20/22 1537 11/20/22 1800 11/20/22 1821  BP:   (!) 148/97 (!) 175/99  Pulse: 80   70  Resp: 16   14  Temp:  (!) 97.3 F (36.3 C)  99 F (37.2 C)  TempSrc:    Oral  SpO2: 100%   100%  Weight:      Height:       Eyes: PERRL, lids and conjunctivae normal ENMT: Mucous membranes are moist. Posterior pharynx clear of any exudate or lesions.Normal dentition.  Neck: normal, supple, no masses, no thyromegaly Respiratory: clear to auscultation bilaterally, no wheezing, no crackles. Normal respiratory effort. No accessory muscle use.  Cardiovascular: Regular rate and rhythm, no murmurs / rubs / gallops. No extremity edema. 2+ pedal pulses. No carotid bruits.  Abdomen: Soft, nondistended, positive bowel sounds.  No rebound.  No guarding.  Positive left CVA TTP.  Musculoskeletal: no clubbing / cyanosis. No joint deformity upper and lower extremities. Good ROM, no contractures. Normal muscle tone.  Skin: no rashes, lesions, ulcers. No induration Neurologic: CN 2-12 grossly intact. Sensation intact, DTR normal. Strength 5/5 in all 4.  Psychiatric: Normal judgment and insight. Alert and oriented x 3. Normal mood.   Labs on Admission: I have personally reviewed following labs and imaging studies  CBC: Recent Labs  Lab 11/20/22 0836  WBC 9.6  HGB 17.1*  HCT 52.1*  MCV 93.5  PLT 275    Basic Metabolic Panel: Recent Labs  Lab 11/20/22 0836  NA 135  K 4.4  CL 100  CO2 25  GLUCOSE 125*  BUN 6  CREATININE 1.43*  CALCIUM 9.4    GFR: Estimated Creatinine Clearance: 59.2  mL/min (A) (by C-G formula based on SCr of 1.43 mg/dL (H)).  Liver Function Tests: Recent Labs  Lab 11/20/22 1314  AST 40  ALT 26  ALKPHOS 59  BILITOT 1.4*  PROT 7.4  ALBUMIN 4.0    Urine analysis:    Component Value Date/Time   COLORURINE YELLOW 11/20/2022 1313   APPEARANCEUR HAZY (A) 11/20/2022 1313   LABSPEC 1.016 11/20/2022 1313   PHURINE 5.0 11/20/2022 1313   GLUCOSEU NEGATIVE 11/20/2022 1313   HGBUR SMALL (A) 11/20/2022 1313   BILIRUBINUR NEGATIVE 11/20/2022 1313   KETONESUR 5 (A) 11/20/2022 1313   PROTEINUR 100 (A) 11/20/2022 1313   NITRITE NEGATIVE 11/20/2022 1313   LEUKOCYTESUR NEGATIVE 11/20/2022 1313    Radiological Exams on Admission: ECHOCARDIOGRAM COMPLETE  Result Date: 11/20/2022    ECHOCARDIOGRAM REPORT   Patient Name:   Crista ElliotCHARLIE T Minder  Date of Exam: 11/20/2022 Medical Rec #:  [id number]        Height:       71.0 in Accession #:    [id number]       Weight:       186.9 lb Date of Birth:  September 19, 1965         BSA:          2.049 m Patient Age:    57 years         BP:           140/98 mmHg Patient Gender: M                HR:           61 bpm. Exam Location:  Inpatient Procedure: 2D Echo, Cardiac Doppler and Color Doppler Indications:    Chest pain  History:        Patient has no prior history of Echocardiogram examinations.                 Signs/Symptoms:Chest Pain; Risk Factors:Current Smoker,                 Dyslipidemia and Hypertension.  Sonographer:    Dondra Prader RVT RCS Referring Phys: [id number] [facility] A SMOOT IMPRESSIONS  1. Left ventricular ejection fraction, by estimation, is 55 to 60%. The left ventricle has normal function. The left ventricle has no regional wall motion abnormalities. There is mild concentric left ventricular hypertrophy. Left ventricular diastolic parameters are consistent with Grade I diastolic dysfunction (impaired relaxation).  2. Right ventricular systolic function is normal. The right ventricular size is normal. Tricuspid regurgitation signal  is inadequate for assessing PA pressure.  3. The mitral valve is normal in structure. No evidence of mitral valve regurgitation. No evidence of mitral stenosis.  4. The aortic valve is tricuspid. There is mild calcification of the aortic valve. Aortic valve regurgitation is not visualized. Aortic valve sclerosis/calcification is present, without any evidence of aortic stenosis. Aortic valve area, by VTI measures  2.05 cm. Aortic valve mean gradient measures 5.0 mmHg. Aortic valve Vmax measures 1.34 m/s.  5. The inferior vena cava is normal in size with greater than 50% respiratory variability, suggesting right atrial pressure of 3 mmHg. FINDINGS  Left Ventricle: Left ventricular ejection fraction, by estimation, is 55 to 60%. The left ventricle has normal function. The left ventricle has no regional wall motion abnormalities. The left ventricular internal cavity size was normal in size. There is  mild concentric left ventricular hypertrophy. Left ventricular diastolic parameters are consistent with Grade I diastolic dysfunction (impaired relaxation). Normal left ventricular filling pressure. Right Ventricle: The right ventricular size is normal. No increase in right ventricular wall thickness. Right ventricular systolic function is normal. Tricuspid regurgitation signal is inadequate for assessing PA pressure. Left Atrium: Left atrial size was normal in size. Right Atrium: Right atrial size was normal in size. Pericardium: There is no evidence of pericardial effusion. Mitral Valve: The mitral valve is normal in structure. No evidence of mitral valve regurgitation. No evidence of mitral valve stenosis. Tricuspid Valve: The tricuspid valve is normal in structure. Tricuspid valve regurgitation is not demonstrated. No evidence of tricuspid stenosis. Aortic Valve: The aortic valve is tricuspid. There is mild calcification of the aortic valve. Aortic valve regurgitation is not visualized. Aortic valve  sclerosis/calcification is present, without any evidence of aortic stenosis. Aortic valve mean gradient measures 5.0 mmHg. Aortic valve peak gradient measures 7.2 mmHg. Aortic valve  area, by VTI measures 2.05 cm. Pulmonic Valve: The pulmonic valve was normal in structure. Pulmonic valve regurgitation is trivial. No evidence of pulmonic stenosis. Aorta: The aortic root is normal in size and structure. Venous: The inferior vena cava is normal in size with greater than 50% respiratory variability, suggesting right atrial pressure of 3 mmHg. IAS/Shunts: No atrial level shunt detected by color flow Doppler.  LEFT VENTRICLE PLAX 2D LVIDd:         5.20 cm      Diastology LVIDs:         4.00 cm      LV e' medial:    3.64 cm/s LV PW:         1.30 cm      LV E/e' medial:  9.9 LV IVS:        1.30 cm      LV e' lateral:   5.43 cm/s LVOT diam:     2.00 cm      LV E/e' lateral: 6.6 LV SV:         61 LV SV Index:   30 LVOT Area:     3.14 cm  LV Volumes (MOD) LV vol d, MOD A2C: 193.0 ml LV vol d, MOD A4C: 199.0 ml LV vol s, MOD A2C: 72.2 ml LV vol s, MOD A4C: 75.8 ml LV SV MOD A2C:     120.8 ml LV SV MOD A4C:     199.0 ml LV SV MOD BP:      122.7 ml RIGHT VENTRICLE             IVC RV Basal diam:  3.60 cm     IVC diam: 1.60 cm RV S prime:     12.90 cm/s TAPSE (M-mode): 2.4 cm LEFT ATRIUM             Index        RIGHT ATRIUM           Index LA diam:        3.80 cm 1.85 cm/m   RA Area:     15.50 cm LA Vol (A2C):   69.2 ml 33.77 ml/m  RA Volume:   40.50 ml  19.77 ml/m LA Vol (A4C):   47.5 ml 23.18 ml/m LA Biplane Vol: 60.7 ml 29.62 ml/m  AORTIC VALVE                     PULMONIC VALVE AV Area (Vmax):    2.07 cm      PV Vmax:          0.87 m/s AV Area (Vmean):   1.88 cm      PV Peak grad:     3.1 mmHg AV Area (VTI):     2.05 cm      PR End Diast Vel: 1.43 msec AV Vmax:           134.00 cm/s AV Vmean:          105.000 cm/s AV VTI:            0.298 m AV Peak Grad:      7.2 mmHg AV Mean Grad:      5.0 mmHg LVOT Vmax:          88.30 cm/s LVOT Vmean:        62.800 cm/s LVOT VTI:          0.194 m LVOT/AV VTI ratio: 0.65  AORTA Ao Root diam: 3.40 cm  Ao Asc diam:  3.30 cm Ao Arch diam: 2.7 cm MITRAL VALVE MV Area (PHT): 2.94 cm    SHUNTS MV Decel Time: 258 msec    Systemic VTI:  0.19 m MV E velocity: 36.00 cm/s  Systemic Diam: 2.00 cm MV A velocity: 74.40 cm/s MV E/A ratio:  0.48 Armanda Magic MD Electronically signed by Armanda Magic MD Signature Date/Time: 11/20/2022/4:40:03 PM    Final    CT Angio Chest/Abd/Pel for Dissection W and/or Wo Contrast  Result Date: 11/20/2022 CLINICAL DATA:  Arterial embolism suspected, non-extremity, determine source. EXAM: CT ANGIOGRAPHY CHEST, ABDOMEN AND PELVIS TECHNIQUE: Non-contrast CT of the chest was initially obtained. Multidetector CT imaging through the chest, abdomen and pelvis was performed using the standard protocol during bolus administration of intravenous contrast. Multiplanar reconstructed images and MIPs were obtained and reviewed to evaluate the vascular anatomy. RADIATION DOSE REDUCTION: This exam was performed according to the departmental dose-optimization program which includes automated exposure control, adjustment of the mA and/or kV according to patient size and/or use of iterative reconstruction technique. CONTRAST:  OMNIPAQUE IOHEXOL 350 MG/ML SOLN COMPARISON:  CT abdomen and pelvis 11/20/2022 FINDINGS: CTA CHEST FINDINGS Cardiovascular: There is no evidence of a thoracic aortic intramural hematoma on noncontrast images. There is mild thoracic aortic atherosclerosis without evidence of a dissection or aneurysm. There is a common origin of the brachiocephalic and left common carotid arteries, a normal variant. No pulmonary arterial emboli are identified on this nondedicated study. The heart is borderline enlarged. There is no pericardial effusion. Mediastinum/Nodes: No enlarged axillary, mediastinal, or hilar lymph nodes. Unremarkable thyroid and esophagus. Lungs/Pleura: No  pleural effusion or pneumothorax. No consolidation or suspicious nodule. Suspected small focus of mucous near the right mainstem bronchus orifice. Musculoskeletal: No acute osseous abnormality or suspicious osseous lesion. Review of the MIP images confirms the above findings. CTA ABDOMEN AND PELVIS FINDINGS VASCULAR Aorta: Mild soft and calcified plaque, predominantly in the infrarenal aorta. No dissection, aneurysm, or significant stenosis. Celiac: Patent without evidence of aneurysm, dissection, vasculitis or significant stenosis. SMA: Patent without evidence of aneurysm, dissection, vasculitis or significant stenosis. Renals: Both renal arteries are patent proximally without evidence of aneurysm, dissection, or fibromuscular dysplasia. There is occlusion of multiple small, distal left renal artery branch vessels. IMA: Patent without evidence of aneurysm, dissection, vasculitis or significant stenosis. Inflow: Moderate atherosclerosis without evidence of a significant stenosis, dissection, or aneurysm. Veins: No obvious venous abnormality within the limitations of this arterial phase study. Review of the MIP images confirms the above findings. NON-VASCULAR Hepatobiliary: Multiple small hypodense liver lesions with the largest measuring 1.5 cm and likely reflective of a cyst and with the majority of the other lesions being subcentimeter in size and too small to fully characterize. Unremarkable gallbladder. No biliary dilatation. Pancreas: Unremarkable. Spleen: Unremarkable. Adrenals/Urinary Tract: Unremarkable adrenal glands. Small focus of cortical scarring laterally in the mid inferior right kidney. Multiple large wedge-shaped regions of absent enhancement in the left kidney, similar to today's earlier CT. No hydronephrosis. Excreted contrast from today's earlier CT in the right renal collecting system, right ureter, and bladder. Stomach/Bowel: The stomach is grossly unremarkable. There is extensive left-sided  colonic diverticulosis without evidence of acute diverticulitis. There is no evidence of bowel obstruction. The appendix is unremarkable. Lymphatic: No enlarged lymph nodes in the abdomen or pelvis. Reproductive: Unremarkable prostate. Other: No ascites or pneumoperitoneum. Musculoskeletal: No acute osseous abnormality or suspicious osseous lesion. Review of the MIP images confirms the above findings. IMPRESSION: 1. 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 Wide patency of the proximal left renal artery. 2. Aortic atherosclerosis without dissection or aneurysm. 3. Colonic diverticulosis. Aortic Atherosclerosis (ICD10-I70.0). Electronically Signed   By: Sebastian Ache M.D.   On: 11/20/2022 15:58   CT ABDOMEN PELVIS W CONTRAST  Result Date: 11/20/2022 CLINICAL DATA:  Abdominal pain EXAM: CT ABDOMEN AND PELVIS WITH CONTRAST TECHNIQUE: Multidetector CT imaging of the abdomen and pelvis was performed using the standard protocol following bolus administration of intravenous contrast. RADIATION DOSE REDUCTION: This exam was performed according to the departmental dose-optimization program which includes automated exposure control, adjustment of the mA and/or kV according to patient size and/or use of iterative reconstruction technique. CONTRAST:  75mL OMNIPAQUE IOHEXOL 350 MG/ML SOLN COMPARISON:  None Available. FINDINGS: Lower chest: No focal infiltrates are seen in lower lung fields. Hepatobiliary: There are a few low-density foci in liver lesion measuring less than 1.5 cm. There is no dilation of bile ducts. Gallbladder is not distended. Pancreas: No focal abnormalities are seen. Spleen: Unremarkable. Adrenals/Urinary Tract: Adrenals are unremarkable. There is no hydronephrosis. There are multiple large areas of decreased enhancement in left kidney. There is no significant perinephric stranding. As far as seen, main left renal artery is patent. Left atrium and left  ventricle are not included in their entirety for evaluation. There are no renal or ureteral stones. There is focal cortical thinning in the lateral aspect of lower pole of right kidney. Ureters are not dilated. Urinary bladder is unremarkable. Stomach/Bowel: Stomach is not distended. There is mild diffuse mucosal enhancement in stomach. Small bowel loops are not dilated. Appendix is not dilated. There is no significant wall thickening in colon. Scattered diverticula are seen in colon without signs of focal acute diverticulitis. Vascular/Lymphatic: There are scattered atherosclerotic plaques and calcifications in aorta and its major branches. Reproductive: There are coarse calcifications in prostate. Other: There is no ascites or pneumoperitoneum. Umbilical hernia containing fat is seen. Musculoskeletal: No acute findings are seen. Degenerative changes are noted in lumbar spine, more so at the L4-L5 and L5-S1 levels with encroachment of neural foramina. IMPRESSION: There are multiple patchy foci of decreased or absent perfusion in the left kidney. This may suggest acute pyelonephritis. Another possibility would be multiple emboli in peripheral left renal artery branches from central cardiac source. Please correlate with clinical and laboratory findings. If there is clinical suspicion for arterial emboli arising from the heart, echocardiogram may be considered. There is no evidence of intestinal obstruction or pneumoperitoneum. Appendix is not dilated. There is no hydronephrosis. There is mild diffuse mucosal enhancement in the stomach which may be an artifact caused by incomplete distention although suggest gastritis. There are multiple small low-density foci in liver, possibly cysts or hemangiomas. Diverticulosis of colon without signs of focal acute diverticulitis. Electronically Signed   By: Ernie Avena M.D.   On: 11/20/2022 13:40    EKG: Independently reviewed.  Normal sinus rhythm with T wave inversion  in leads III and aVF.  Assessment/Plan Principal Problem:   Renal infarct Active Problems:   Tobacco abuse   HTN (hypertension)   Hyperlipidemia   #1 left renal infarct -Patient presented with left CVA tenderness, nausea. -Urinalysis negative for any acute infection. 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  Wide patency of proximal left renal artery.  Aortic atherosclerosis without dissection  or aneurysm noted. -??  Etiology.  Patient with no history of A-fib. -CT angiogram chest done negative for pulmonary emboli. -2D echo done with normal EF, NWMA, grade 1 diastolic dysfunction, no emboli noted. -Place patient on telemetry to monitor for A-fib. -Check a hypercoagulable panel. -Check a fasting lipid panel. -Place on IV heparin for anticoagulation per pharmacy. -IVF. -Lipitor 40 mg daily. -Consult with vascular surgery and nephrology.  2.  Hypertension -Patient states has not taken his blood pressure medications in about 2 years.  States has not seen his PCP in over a year however does endorse that he has his antihypertensive medications at home however cannot remember the name. -Start Norvasc 10 mg daily.  IV hydralazine as needed.  3.  History of hyperlipidemia -Check a fasting lipid panel. -Place on Lipitor 40 mg daily.  4.  Tobacco abuse -Tobacco cessation stressed to patient. -Patient at this time declining a nicotine patch.  DVT prophylaxis: Heparin Code Status:   Full Family Communication:  Updated patient.  No family at bedside. Disposition Plan:   Patient is from:  Home  Anticipated DC to:  Home  Anticipated DC date:  TBD  Anticipated DC barriers: Clinical improvement, Consults called:  Nephrology: Dr Malen Gauze    Vascular surgery: Dr.Hawken Admission status:  Place in observation/telemetry.  Severity of Illness: The  appropriate patient status for this patient is OBSERVATION. Observation status is judged to be reasonable and necessary in order to provide the required intensity of service to ensure the patient's safety. The patient's presenting symptoms, physical exam findings, and initial radiographic and laboratory data in the context of their medical condition is felt to place them at decreased risk for further clinical deterioration. Furthermore, it is anticipated that the patient will be medically stable for discharge from the hospital within 2 midnights of admission.     Ramiro Harvest MD [facility]  How to contact the [facility] Attending or Consulting provider 7A - 7P or covering provider during after hours 7P -7A, for this patient?   Check the care team in [facility] and look for a) attending/consulting [facility] provider listed and b) the [facility] team listed Log into [email or web address] and use [facility]'s universal password to access. If you do not have the password, please contact the hospital operator. Locate the [facility] provider you are looking for under [facility] and page to a number that you can be directly reached. If you still have difficulty reaching the provider, please page the [facility] (Director on Call) for the Hospitalists listed on amion for assistance.  11/20/2022, 6:34 PM

## 2022-11-20 NOTE — Consult Note (Signed)
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 No clear cause. No evidence of cardiac embolus on transthoracic echocardiogram. No evidence of PFO. Trivial aortic atherosclerosis not likely the embolic source. No indication for vascular intervention at the moment. Recommend continued work up for thrombotic and/or embolic source.  Could consider transesophageal echocardiogram.  Agree with anticoagulation with heparin.  Will transition to p.o. agent once workup has been completed.  Please call for any questions  CHIEF COMPLAINT: Left flank pain  HISTORY OF PRESENT ILLNESS: Alfred Gray is a 57 y.o. male admitted to the internal medicine service for left renal infarction.  He presented to the ER for 2 to 3-day history of left flank pain with nausea workup was initiated he was found to be hypertensive and had an elevated creatinine and white blood cell count.  Urinalysis showed some hematuria.  CT scan showed hypoperfusion of the left kidney.  This was followed by CT angiogram which showed multifocal infarct of the left kidney.  An echocardiogram was performed and did not show any intracardiac thrombus.  Vascular consultation was requested to help evaluate renal infarction.  Past Medical History:  Diagnosis Date   HTN (hypertension) 11/20/2022   Pancreatitis     Past Surgical History:  Procedure Laterality Date   WRIST SURGERY     WRIST SURGERY      Family History  Problem Relation Age of Onset   Diabetes Mother    Diabetes Father    Dementia Father    Hypertension Other        Various family members    Social History   Socioeconomic History   Marital status: Single    Spouse name: Not on file   Number of children: Not on file   Years of education: Not on file   Highest education level: Not on file  Occupational History   Not on file  Tobacco Use   Smoking status: Every Day    Packs/day: .3    Types: Cigarettes    Smokeless tobacco: Not on file  Substance and Sexual Activity   Alcohol use: No   Drug use: Yes    Frequency: 14.0 times per week    Types: Marijuana   Sexual activity: Not on file  Other Topics Concern   Not on file  Social History Narrative   Not on file   Social Determinants of Health   Financial Resource Strain: Not on file  Food Insecurity: Not on file  Transportation Needs: Not on file  Physical Activity: Not on file  Stress: Not on file  Social Connections: Not on file  Intimate Partner Violence: Not on file    No Known Allergies  Current Facility-Administered Medications  Medication Dose Route Frequency Provider Last Rate Last Admin   0.9 %  sodium chloride infusion   Intravenous Continuous Rodolph Bonghompson, Daniel V, MD 125 mL/hr at 11/20/22 1834 New Bag at 11/20/22 1834   acetaminophen (TYLENOL) tablet 650 mg  650 mg Oral Q6H PRN Rodolph Bonghompson, Daniel V, MD       Or   acetaminophen (TYLENOL) suppository 650 mg  650 mg Rectal Q6H PRN Rodolph Bonghompson, Daniel V, MD       amLODipine (NORVASC) tablet 10 mg  10 mg Oral Daily Rodolph Bonghompson, Daniel V, MD   10 mg at 11/20/22 1829   atorvastatin (LIPITOR) tablet 40 mg  40 mg Oral Daily Rodolph Bonghompson, Daniel V, MD       heparin  ADULT infusion 100 units/mL (25000 units/256mL)  1,300 Units/hr Intravenous Continuous Titus Mould, RPH       heparin bolus via infusion 4,000 Units  4,000 Units Intravenous Once Titus Mould, RPH       hydrALAZINE (APRESOLINE) injection 10 mg  10 mg Intravenous Q6H PRN Rodolph Bong, MD       ondansetron [facility]) tablet 4 mg  4 mg Oral Q6H PRN Rodolph Bong, MD       Or   ondansetron [facility]) injection 4 mg  4 mg Intravenous Q6H PRN Rodolph Bong, MD       oxyCODONE (Oxy IR/ROXICODONE) immediate release tablet 5 mg  5 mg Oral Q4H PRN Rodolph Bong, MD       pantoprazole (PROTONIX) EC tablet 40 mg  40 mg Oral Q0600 Rodolph Bong, MD       polyethylene glycol (MIRALAX / GLYCOLAX) packet 17 g  17 g Oral  Daily PRN Rodolph Bong, MD       sodium chloride flush (NS) 0.9 % injection 3 mL  3 mL Intravenous Q12H Rodolph Bong, MD       sorbitol 70 % solution 30 mL  30 mL Oral Daily PRN Rodolph Bong, MD        PHYSICAL EXAM Vitals:   11/20/22 1530 11/20/22 1537 11/20/22 1800 11/20/22 1821  BP:   (!) 148/97 (!) 175/99  Pulse: 80   70  Resp: 16   14  Temp:  (!) 97.3 F (36.3 C)  99 F (37.2 C)  TempSrc:    Oral  SpO2: 100%   100%  Weight:      Height:       Healthy middle-age man in no acute distress Regular rate and rhythm Unlabored breathing  PERTINENT LABORATORY AND RADIOLOGIC DATA  Most recent CBC    Latest Ref Rng & Units 11/20/2022    8:36 AM 05/15/2012    8:40 AM 05/14/2012    6:10 AM  CBC  WBC 4.0 - 10.5 K/uL 9.6  12.6  13.1   Hemoglobin 13.0 - 17.0 g/dL 25.4  27.0  62.3   Hematocrit 39.0 - 52.0 % 52.1  40.0  41.2   Platelets 150 - 400 K/uL 275  120  230      Most recent CMP    Latest Ref Rng & Units 11/20/2022    1:14 PM 11/20/2022    8:36 AM 05/15/2012    8:40 AM  CMP  Glucose 70 - 99 mg/dL  762  83   BUN 6 - 20 mg/dL  6  12   Creatinine 8.31 - 1.24 mg/dL  5.17  6.16   Sodium 073 - 145 mmol/L  135  136   Potassium 3.5 - 5.1 mmol/L  4.4  3.9   Chloride 98 - 111 mmol/L  100  105   CO2 22 - 32 mmol/L  25  22   Calcium 8.9 - 10.3 mg/dL  9.4  8.8   Total Protein 6.5 - 8.1 g/dL 7.4   6.6   Total Bilirubin 0.3 - 1.2 mg/dL 1.4   1.4   Alkaline Phos 38 - 126 U/L 59   58   AST 15 - 41 U/L 40   18   ALT 0 - 44 U/L 26   28     Renal function Estimated Creatinine Clearance: 59.2 mL/min (A) (by C-G formula based on SCr of 1.43 mg/dL (H)).  Hgb  A1c MFr Bld (%)  Date Value  05/14/2012 5.8 (H)    LDL Cholesterol  Date Value Ref Range Status  05/14/2012 86 0 - 99 mg/dL Final    Comment:           Total Cholesterol/HDL:CHD Risk Coronary Heart Disease Risk Table                     Men   Women  1/2 Average Risk   3.4   3.3  Average Risk       5.0    4.4  2 X Average Risk   9.6   7.1  3 X Average Risk  23.4   11.0        Use the calculated Patient Ratio above and the CHD Risk Table to determine the patient's CHD Risk.        ATP III CLASSIFICATION (LDL):  <100     mg/dL   Optimal  459-977  mg/dL   Near or Above                    Optimal  130-159  mg/dL   Borderline  414-239  mg/dL   High  >532     mg/dL   Very High    Echocardiogram shows no intracardiac thrombus  CT angiogram personally reviewed in detail shows trivial aortic atherosclerosis.  No "coral reef" or other exophytic projection into the aorta is noted.  There is multifocal renal infarct.  The renal artery upstream is very healthy without atherosclerosis.  Rande Brunt. Lenell Antu, MD FACS [facility] Phone Number: [phone] 11/20/2022 7:46 PM   Total time spent on preparing this encounter including chart review, data review, collecting history, examining the patient, coordinating care for this new patient, 45 minutes.  Portions of this report may have been transcribed using voice recognition software.  Every effort has been made to ensure accuracy; however, inadvertent computerized transcription errors may still be present.

## 2022-11-20 NOTE — ED Notes (Signed)
ED TO INPATIENT HANDOFF REPORT  ED Nurse Name and Phone #:   S Name/Age/Gender Alfred Gray 57 y.o. male Room/Bed: 005C/005C  Code Status   Code Status: Prior  Home/SNF/Other Home Patient oriented to: self, place, time, and situation Is this baseline? 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 Pt c/o nausea started this morning. Pt c/o frequent urinationx2d.   Allergies No Known Allergies  Level of Care/Admitting Diagnosis ED Disposition     ED Disposition  Admit   Condition  --   Comment  [facility] [[id number]]  Level of Care: Telemetry Cardiac [103]  [facility] patient [facility] or [facility] if equivalent level of care is available:: No  Covid Evaluation: Asymptomatic - no recent exposure (last 10 days) testing not required  Diagnosis: Renal infarct [409811][[id number]]  Admitting Physician: Rodolph BongHOMPSON, DANIEL V [[id number]]  Attending Physician: Rodolph BongHOMPSON, DANIEL V [[id number]]          B Medical/Surgery History Past Medical History:  Diagnosis Date   Pancreatitis    Past Surgical History:  Procedure Laterality Date   WRIST SURGERY     WRIST SURGERY       A IV Location/Drains/Wounds Patient Lines/Drains/Airways Status     Active Line/Drains/Airways     Name Placement date Placement time Site Days   Peripheral IV 11/20/22 20 G Anterior;Right Forearm 11/20/22  1219  Forearm  less than 1   Peripheral IV 11/20/22 20 G Anterior;Distal;Left;Upper Arm 11/20/22  1507  Arm  less than 1            Intake/Output Last 24 hours No intake or output data in the 24 hours ending 11/20/22 1747  Labs/Imaging Results for orders placed or performed during the hospital encounter of 11/20/22 (from the past 48 hour(s))  Basic metabolic panel     Status: Abnormal   Collection Time: 11/20/22  8:36 AM  Result Value Ref Range   Sodium 135 135 - 145 mmol/L    Potassium 4.4 3.5 - 5.1 mmol/L   Chloride 100 98 - 111 mmol/L   CO2 25 22 - 32 mmol/L   Glucose, Bld 125 (H) 70 - 99 mg/dL    Comment: Glucose reference range applies only to samples taken after fasting for at least 8 hours.   BUN 6 6 - 20 mg/dL   Creatinine, Ser 9.141.43 (H) 0.61 - 1.24 mg/dL   Calcium 9.4 8.9 - 78.210.3 mg/dL   GFR, Estimated 56 (L) >60 mL/min    Comment: (NOTE) Calculated using the CKD-EPI Creatinine Equation (2021)    Anion gap 10 5 - 15    Comment: Performed at [facility], [address] [place], [place] [id number]  CBC     Status: Abnormal   Collection Time: 11/20/22  8:36 AM  Result Value Ref Range   WBC 9.6 4.0 - 10.5 K/uL   RBC 5.57 4.22 - 5.81 MIL/uL   Hemoglobin 17.1 (H) 13.0 - 17.0 g/dL   HCT 30.852.1 (H) 65.739.0 - 84.652.0 %   MCV 93.5 80.0 - 100.0 fL   MCH 30.7 26.0 - 34.0 pg   MCHC 32.8 30.0 - 36.0 g/dL   RDW 96.214.6 95.211.5 - 84.115.5 %   Platelets 275 150 - 400 K/uL   nRBC 0.0 0.0 - 0.2 %    Comment: Performed at [facility], [address]  [place], [place] [address]  Urinalysis, Routine w reflex microscopic -Urine, Clean Catch     Status: Abnormal   Collection Time: 11/20/22  1:13 PM  Result Value Ref Range   Color, Urine YELLOW YELLOW   APPearance HAZY (A) CLEAR   Specific Gravity, Urine 1.016 1.005 - 1.030   pH 5.0 5.0 - 8.0   Glucose, UA NEGATIVE NEGATIVE mg/dL   Hgb urine dipstick SMALL (A) NEGATIVE   Bilirubin Urine NEGATIVE NEGATIVE   Ketones, ur 5 (A) NEGATIVE mg/dL   Protein, ur 696 (A) NEGATIVE mg/dL   Nitrite NEGATIVE NEGATIVE   Leukocytes,Ua NEGATIVE NEGATIVE   RBC / HPF 0-5 0 - 5 RBC/hpf   WBC, UA 0-5 0 - 5 WBC/hpf   Bacteria, UA RARE (A) NONE SEEN   Squamous Epithelial / HPF 0-5 0 - 5 /HPF   Mucus PRESENT     Comment: Performed at [facility], [address] [place], [place] [address]  Lipase, blood     Status: None   Collection Time: 11/20/22  1:14 PM  Result Value Ref Range   Lipase 31 11 - 51 U/L    Comment:  HEMOLYSIS AT THIS LEVEL MAY AFFECT RESULT Performed at [facility], [address], [place], [place] [address]   Hepatic function panel     Status: Abnormal   Collection Time: 11/20/22  1:14 PM  Result Value Ref Range   Total Protein 7.4 6.5 - 8.1 g/dL   Albumin 4.0 3.5 - 5.0 g/dL   AST 40 15 - 41 U/L    Comment: HEMOLYSIS AT THIS LEVEL MAY AFFECT RESULT   ALT 26 0 - 44 U/L    Comment: HEMOLYSIS AT THIS LEVEL MAY AFFECT RESULT   Alkaline Phosphatase 59 38 - 126 U/L   Total Bilirubin 1.4 (H) 0.3 - 1.2 mg/dL    Comment: HEMOLYSIS AT THIS LEVEL MAY AFFECT RESULT   Bilirubin, Direct 0.5 (H) 0.0 - 0.2 mg/dL    Comment: HEMOLYSIS AT THIS LEVEL MAY AFFECT RESULT   Indirect Bilirubin NOT CALCULATED 0.3 - 0.9 mg/dL    Comment: Performed at [facility], [address] [place], [place] [address]   ECHOCARDIOGRAM COMPLETE  Result Date: 11/20/2022    ECHOCARDIOGRAM REPORT   Patient Name:   Alfred Gray Date of Exam: 11/20/2022 Medical Rec #:  [id number]        Height:       71.0 in Accession #:    [id number]       Weight:       186.9 lb Date of Birth:  November 02, 1965         BSA:          2.049 m Patient Age:    57 years         BP:           140/98 mmHg Patient Gender: M                HR:           61 bpm. Exam Location:  Inpatient Procedure: 2D Echo, Cardiac Doppler and Color Doppler Indications:    Chest pain  History:        Patient has no prior history of Echocardiogram examinations.                 Signs/Symptoms:Chest Pain; Risk Factors:Current Smoker,  Dyslipidemia and Hypertension.  Sonographer:    Dondra Prader RVT RCS Referring Phys: [id number] [facility] IMPRESSIONS  1. Left ventricular ejection fraction, by estimation, is 55 to 60%. The left ventricle has normal function. The left ventricle has no regional wall motion abnormalities. There is mild concentric left ventricular hypertrophy. Left ventricular diastolic parameters are consistent with Grade I diastolic  dysfunction (impaired relaxation).  2. Right ventricular systolic function is normal. The right ventricular size is normal. Tricuspid regurgitation signal is inadequate for assessing PA pressure.  3. The mitral valve is normal in structure. No evidence of mitral valve regurgitation. No evidence of mitral stenosis.  4. The aortic valve is tricuspid. There is mild calcification of the aortic valve. Aortic valve regurgitation is not visualized. Aortic valve sclerosis/calcification is present, without any evidence of aortic stenosis. Aortic valve area, by VTI measures  2.05 cm. Aortic valve mean gradient measures 5.0 mmHg. Aortic valve Vmax measures 1.34 m/s.  5. The inferior vena cava is normal in size with greater than 50% respiratory variability, suggesting right atrial pressure of 3 mmHg. FINDINGS  Left Ventricle: Left ventricular ejection fraction, by estimation, is 55 to 60%. The left ventricle has normal function. The left ventricle has no regional wall motion abnormalities. The left ventricular internal cavity size was normal in size. There is  mild concentric left ventricular hypertrophy. Left ventricular diastolic parameters are consistent with Grade I diastolic dysfunction (impaired relaxation). Normal left ventricular filling pressure. Right Ventricle: The right ventricular size is normal. No increase in right ventricular wall thickness. Right ventricular systolic function is normal. Tricuspid regurgitation signal is inadequate for assessing PA pressure. Left Atrium: Left atrial size was normal in size. Right Atrium: Right atrial size was normal in size. Pericardium: There is no evidence of pericardial effusion. Mitral Valve: The mitral valve is normal in structure. No evidence of mitral valve regurgitation. No evidence of mitral valve stenosis. Tricuspid Valve: The tricuspid valve is normal in structure. Tricuspid valve regurgitation is not demonstrated. No evidence of tricuspid stenosis. Aortic Valve: The  aortic valve is tricuspid. There is mild calcification of the aortic valve. Aortic valve regurgitation is not visualized. Aortic valve sclerosis/calcification is present, without any evidence of aortic stenosis. Aortic valve mean gradient measures 5.0 mmHg. Aortic valve peak gradient measures 7.2 mmHg. Aortic valve area, by VTI measures 2.05 cm. Pulmonic Valve: The pulmonic valve was normal in structure. Pulmonic valve regurgitation is trivial. No evidence of pulmonic stenosis. Aorta: The aortic root is normal in size and structure. Venous: The inferior vena cava is normal in size with greater than 50% respiratory variability, suggesting right atrial pressure of 3 mmHg. IAS/Shunts: No atrial level shunt detected by color flow Doppler.  LEFT VENTRICLE PLAX 2D LVIDd:         5.20 cm      Diastology LVIDs:         4.00 cm      LV e' medial:    3.64 cm/s LV PW:         1.30 cm      LV E/e' medial:  9.9 LV IVS:        1.30 cm      LV e' lateral:   5.43 cm/s LVOT diam:     2.00 cm      LV E/e' lateral: 6.6 LV SV:         61 LV SV Index:   30 LVOT Area:     3.14 cm  LV  Volumes (MOD) LV vol d, MOD A2C: 193.0 ml LV vol d, MOD A4C: 199.0 ml LV vol s, MOD A2C: 72.2 ml LV vol s, MOD A4C: 75.8 ml LV SV MOD A2C:     120.8 ml LV SV MOD A4C:     199.0 ml LV SV MOD BP:      122.7 ml RIGHT VENTRICLE             IVC RV Basal diam:  3.60 cm     IVC diam: 1.60 cm RV S prime:     12.90 cm/s TAPSE (M-mode): 2.4 cm LEFT ATRIUM             Index        RIGHT ATRIUM           Index LA diam:        3.80 cm 1.85 cm/m   RA Area:     15.50 cm LA Vol (A2C):   69.2 ml 33.77 ml/m  RA Volume:   40.50 ml  19.77 ml/m LA Vol (A4C):   47.5 ml 23.18 ml/m LA Biplane Vol: 60.7 ml 29.62 ml/m  AORTIC VALVE                     PULMONIC VALVE AV Area (Vmax):    2.07 cm      PV Vmax:          0.87 m/s AV Area (Vmean):   1.88 cm      PV Peak grad:     3.1 mmHg AV Area (VTI):     2.05 cm      PR End Diast Vel: 1.43 msec AV Vmax:           134.00 cm/s  AV Vmean:          105.000 cm/s AV VTI:            0.298 m AV Peak Grad:      7.2 mmHg AV Mean Grad:      5.0 mmHg LVOT Vmax:         88.30 cm/s LVOT Vmean:        62.800 cm/s LVOT VTI:          0.194 m LVOT/AV VTI ratio: 0.65  AORTA Ao Root diam: 3.40 cm Ao Asc diam:  3.30 cm Ao Arch diam: 2.7 cm MITRAL VALVE MV Area (PHT): 2.94 cm    SHUNTS MV Decel Time: 258 msec    Systemic VTI:  0.19 m MV E velocity: 36.00 cm/s  Systemic Diam: 2.00 cm MV A velocity: 74.40 cm/s MV E/A ratio:  0.48 Armanda Magic MD Electronically signed by Armanda Magic MD Signature Date/Time: 11/20/2022/4:40:03 PM    Final    CT Angio Chest/Abd/Pel for Dissection W and/or Wo Contrast  Result Date: 11/20/2022 CLINICAL DATA:  Arterial embolism suspected, non-extremity, determine source. EXAM: CT ANGIOGRAPHY CHEST, ABDOMEN AND PELVIS TECHNIQUE: Non-contrast CT of the chest was initially obtained. Multidetector CT imaging through the chest, abdomen and pelvis was performed using the standard protocol during bolus administration of intravenous contrast. Multiplanar reconstructed images and MIPs were obtained and reviewed to evaluate the vascular anatomy. RADIATION DOSE REDUCTION: This exam was performed according to the departmental dose-optimization program which includes automated exposure control, adjustment of the mA and/or kV according to patient size and/or use of iterative reconstruction technique. CONTRAST:  OMNIPAQUE IOHEXOL 350 MG/ML SOLN COMPARISON:  CT abdomen and pelvis 11/20/2022 FINDINGS: CTA CHEST FINDINGS Cardiovascular:  There is no evidence of a thoracic aortic intramural hematoma on noncontrast images. There is mild thoracic aortic atherosclerosis without evidence of a dissection or aneurysm. There is a common origin of the brachiocephalic and left common carotid arteries, a normal variant. No pulmonary arterial emboli are identified on this nondedicated study. The heart is borderline enlarged. There is no pericardial  effusion. Mediastinum/Nodes: No enlarged axillary, mediastinal, or hilar lymph nodes. Unremarkable thyroid and esophagus. Lungs/Pleura: No pleural effusion or pneumothorax. No consolidation or suspicious nodule. Suspected small focus of mucous near the right mainstem bronchus orifice. Musculoskeletal: No acute osseous abnormality or suspicious osseous lesion. Review of the MIP images confirms the above findings. CTA ABDOMEN AND PELVIS FINDINGS VASCULAR Aorta: Mild soft and calcified plaque, predominantly in the infrarenal aorta. No dissection, aneurysm, or significant stenosis. Celiac: Patent without evidence of aneurysm, dissection, vasculitis or significant stenosis. SMA: Patent without evidence of aneurysm, dissection, vasculitis or significant stenosis. Renals: Both renal arteries are patent proximally without evidence of aneurysm, dissection, or fibromuscular dysplasia. There is occlusion of multiple small, distal left renal artery branch vessels. IMA: Patent without evidence of aneurysm, dissection, vasculitis or significant stenosis. Inflow: Moderate atherosclerosis without evidence of a significant stenosis, dissection, or aneurysm. Veins: No obvious venous abnormality within the limitations of this arterial phase study. Review of the MIP images confirms the above findings. NON-VASCULAR Hepatobiliary: Multiple small hypodense liver lesions with the largest measuring 1.5 cm and likely reflective of a cyst and with the majority of the other lesions being subcentimeter in size and too small to fully characterize. Unremarkable gallbladder. No biliary dilatation. Pancreas: Unremarkable. Spleen: Unremarkable. Adrenals/Urinary Tract: Unremarkable adrenal glands. Small focus of cortical scarring laterally in the mid inferior right kidney. Multiple large wedge-shaped regions of absent enhancement in the left kidney, similar to today's earlier CT. No hydronephrosis. Excreted contrast from today's earlier CT in the  right renal collecting system, right ureter, and bladder. Stomach/Bowel: The stomach is grossly unremarkable. There is extensive left-sided colonic diverticulosis without evidence of acute diverticulitis. There is no evidence of bowel obstruction. The appendix is unremarkable. Lymphatic: No enlarged lymph nodes in the abdomen or pelvis. Reproductive: Unremarkable prostate. Other: No ascites or pneumoperitoneum. Musculoskeletal: No acute osseous abnormality or suspicious osseous lesion. Review of the MIP images confirms the above findings. IMPRESSION: 1. 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 Wide patency of the proximal left renal artery. 2. Aortic atherosclerosis without dissection or aneurysm. 3. Colonic diverticulosis. Aortic Atherosclerosis (ICD10-I70.0). Electronically Signed   By: Sebastian Ache M.D.   On: 11/20/2022 15:58   CT ABDOMEN PELVIS W CONTRAST  Result Date: 11/20/2022 CLINICAL DATA:  Abdominal pain EXAM: CT ABDOMEN AND PELVIS WITH CONTRAST TECHNIQUE: Multidetector CT imaging of the abdomen and pelvis was performed using the standard protocol following bolus administration of intravenous contrast. RADIATION DOSE REDUCTION: This exam was performed according to the departmental dose-optimization program which includes automated exposure control, adjustment of the mA and/or kV according to patient size and/or use of iterative reconstruction technique. CONTRAST:  6mL OMNIPAQUE IOHEXOL 350 MG/ML SOLN COMPARISON:  None Available. FINDINGS: Lower chest: No focal infiltrates are seen in lower lung fields. Hepatobiliary: There are a few low-density foci in liver lesion measuring less than 1.5 cm. There is no dilation of bile ducts. Gallbladder is not distended. Pancreas: No focal abnormalities are seen. Spleen: Unremarkable. Adrenals/Urinary Tract: Adrenals are unremarkable. There is no hydronephrosis. There are multiple large areas of decreased enhancement  in  left kidney. There is no significant perinephric stranding. As far as seen, main left renal artery is patent. Left atrium and left ventricle are not included in their entirety for evaluation. There are no renal or ureteral stones. There is focal cortical thinning in the lateral aspect of lower pole of right kidney. Ureters are not dilated. Urinary bladder is unremarkable. Stomach/Bowel: Stomach is not distended. There is mild diffuse mucosal enhancement in stomach. Small bowel loops are not dilated. Appendix is not dilated. There is no significant wall thickening in colon. Scattered diverticula are seen in colon without signs of focal acute diverticulitis. Vascular/Lymphatic: There are scattered atherosclerotic plaques and calcifications in aorta and its major branches. Reproductive: There are coarse calcifications in prostate. Other: There is no ascites or pneumoperitoneum. Umbilical hernia containing fat is seen. Musculoskeletal: No acute findings are seen. Degenerative changes are noted in lumbar spine, more so at the L4-L5 and L5-S1 levels with encroachment of neural foramina. IMPRESSION: There are multiple patchy foci of decreased or absent perfusion in the left kidney. This may suggest acute pyelonephritis. Another possibility would be multiple emboli in peripheral left renal artery branches from central cardiac source. Please correlate with clinical and laboratory findings. If there is clinical suspicion for arterial emboli arising from the heart, echocardiogram may be considered. There is no evidence of intestinal obstruction or pneumoperitoneum. Appendix is not dilated. There is no hydronephrosis. There is mild diffuse mucosal enhancement in the stomach which may be an artifact caused by incomplete distention although suggest gastritis. There are multiple small low-density foci in liver, possibly cysts or hemangiomas. Diverticulosis of colon without signs of focal acute diverticulitis. Electronically  Signed   By: Ernie Avena M.D.   On: 11/20/2022 13:40    Pending Labs Unresulted Labs (From admission, onward)     Start     Ordered   11/20/22 1747  Sodium, urine, random  Once,   R        11/20/22 1746   11/20/22 1747  Creatinine, urine, random  Once,   R        11/20/22 1746            Vitals/Pain Today's Vitals   11/20/22 1220 11/20/22 1415 11/20/22 1430 11/20/22 1537  BP:  (!) 178/105 (!) 171/104   Pulse:  66 64   Resp:  (!) 21 19   Temp:    (!) 97.3 F (36.3 C)  TempSrc:      SpO2:  100% 100%   Weight:      Height:      PainSc: 7        Isolation Precautions No active isolations  Medications Medications  0.9 %  sodium chloride infusion (has no administration in time range)  sodium chloride 0.9 % bolus 1,000 mL (0 mLs Intravenous Stopped 11/20/22 1400)  morphine (PF) 4 MG/ML injection 4 mg (4 mg Intravenous Given 11/20/22 1220)  ondansetron (ZOFRAN) injection 4 mg (4 mg Intravenous Given 11/20/22 1235)  iohexol (OMNIPAQUE) 350 MG/ML injection 75 mL (75 mLs Intravenous Contrast Given 11/20/22 1308)  iohexol (OMNIPAQUE) 350 MG/ML injection 100 mL (100 mLs Intravenous Contrast Given 11/20/22 1530)    Mobility walks     Focused Assessments Renal Assessment Handoff:  Hemodialysis Schedule:  Last Hemodialysis date and time:    Restricted appendage:    R Recommendations: See Admitting Provider Note  Report given to:   Additional Notes:

## 2022-11-20 NOTE — ED Triage Notes (Signed)
Pt c/o lower back painx2d. Pt c/o nausea started this morning. Pt c/o frequent urinationx2d.

## 2022-11-21 ENCOUNTER — Observation Stay (HOSPITAL_COMMUNITY): Payer: Commercial Managed Care - PPO

## 2022-11-21 ENCOUNTER — Other Ambulatory Visit (HOSPITAL_COMMUNITY): Payer: Self-pay

## 2022-11-21 DIAGNOSIS — R7989 Other specified abnormal findings of blood chemistry: Secondary | ICD-10-CM | POA: Diagnosis present

## 2022-11-21 DIAGNOSIS — D72829 Elevated white blood cell count, unspecified: Secondary | ICD-10-CM | POA: Diagnosis present

## 2022-11-21 DIAGNOSIS — Z833 Family history of diabetes mellitus: Secondary | ICD-10-CM | POA: Diagnosis not present

## 2022-11-21 DIAGNOSIS — I700 Atherosclerosis of aorta: Secondary | ICD-10-CM | POA: Diagnosis present

## 2022-11-21 DIAGNOSIS — R7401 Elevation of levels of liver transaminase levels: Secondary | ICD-10-CM

## 2022-11-21 DIAGNOSIS — N280 Ischemia and infarction of kidney: Secondary | ICD-10-CM | POA: Diagnosis present

## 2022-11-21 DIAGNOSIS — K5730 Diverticulosis of large intestine without perforation or abscess without bleeding: Secondary | ICD-10-CM | POA: Diagnosis present

## 2022-11-21 DIAGNOSIS — F17210 Nicotine dependence, cigarettes, uncomplicated: Secondary | ICD-10-CM | POA: Diagnosis present

## 2022-11-21 DIAGNOSIS — N179 Acute kidney failure, unspecified: Secondary | ICD-10-CM

## 2022-11-21 DIAGNOSIS — E860 Dehydration: Secondary | ICD-10-CM | POA: Diagnosis present

## 2022-11-21 DIAGNOSIS — Z8249 Family history of ischemic heart disease and other diseases of the circulatory system: Secondary | ICD-10-CM | POA: Diagnosis not present

## 2022-11-21 DIAGNOSIS — R319 Hematuria, unspecified: Secondary | ICD-10-CM | POA: Diagnosis present

## 2022-11-21 DIAGNOSIS — I10 Essential (primary) hypertension: Secondary | ICD-10-CM | POA: Diagnosis present

## 2022-11-21 DIAGNOSIS — E785 Hyperlipidemia, unspecified: Secondary | ICD-10-CM | POA: Diagnosis present

## 2022-11-21 DIAGNOSIS — Z79899 Other long term (current) drug therapy: Secondary | ICD-10-CM | POA: Diagnosis not present

## 2022-11-21 DIAGNOSIS — J449 Chronic obstructive pulmonary disease, unspecified: Secondary | ICD-10-CM | POA: Diagnosis present

## 2022-11-21 LAB — LIPID PANEL
Cholesterol: 199 mg/dL (ref 0–200)
HDL: 85 mg/dL (ref >40)
LDL Cholesterol: 101 mg/dL — ABNORMAL HIGH (ref 0–99)
Total CHOL/HDL Ratio: 2.3 RATIO
Triglycerides: 63 mg/dL (ref <150)
VLDL: 13 mg/dL (ref 0–40)

## 2022-11-21 LAB — COMPREHENSIVE METABOLIC PANEL
ALT: 94 U/L — ABNORMAL HIGH (ref 0–44)
AST: 159 U/L — ABNORMAL HIGH (ref 15–41)
Albumin: 3.7 g/dL (ref 3.5–5.0)
Alkaline Phosphatase: 57 U/L (ref 38–126)
Anion gap: 13 (ref 5–15)
BUN: 12 mg/dL (ref 6–20)
CO2: 21 mmol/L — ABNORMAL LOW (ref 22–32)
Calcium: 9.4 mg/dL (ref 8.9–10.3)
Chloride: 101 mmol/L (ref 98–111)
Creatinine, Ser: 1.58 mg/dL — ABNORMAL HIGH (ref 0.61–1.24)
GFR, Estimated: 50 mL/min — ABNORMAL LOW (ref >60)
Glucose, Bld: 113 mg/dL — ABNORMAL HIGH (ref 70–99)
Potassium: 4.4 mmol/L (ref 3.5–5.1)
Sodium: 135 mmol/L (ref 135–145)
Total Bilirubin: 1.7 mg/dL — ABNORMAL HIGH (ref 0.3–1.2)
Total Protein: 7.1 g/dL (ref 6.5–8.1)

## 2022-11-21 LAB — CBC WITH DIFFERENTIAL/PLATELET
Abs Immature Granulocytes: 0.05 10*3/uL (ref 0.00–0.07)
Basophils Absolute: 0.1 10*3/uL (ref 0.0–0.1)
Basophils Relative: 0 %
Eosinophils Absolute: 0 10*3/uL (ref 0.0–0.5)
Eosinophils Relative: 0 %
HCT: 51.6 % (ref 39.0–52.0)
Hemoglobin: 17.8 g/dL — ABNORMAL HIGH (ref 13.0–17.0)
Immature Granulocytes: 0 %
Lymphocytes Relative: 15 %
Lymphs Abs: 1.9 10*3/uL (ref 0.7–4.0)
MCH: 31.4 pg (ref 26.0–34.0)
MCHC: 34.5 g/dL (ref 30.0–36.0)
MCV: 91 fL (ref 80.0–100.0)
Monocytes Absolute: 1.5 10*3/uL — ABNORMAL HIGH (ref 0.1–1.0)
Monocytes Relative: 11 %
Neutro Abs: 9.6 10*3/uL — ABNORMAL HIGH (ref 1.7–7.7)
Neutrophils Relative %: 74 %
Platelets: 230 10*3/uL (ref 150–400)
RBC: 5.67 MIL/uL (ref 4.22–5.81)
RDW: 14.3 % (ref 11.5–15.5)
WBC: 13.1 10*3/uL — ABNORMAL HIGH (ref 4.0–10.5)
nRBC: 0 % (ref 0.0–0.2)

## 2022-11-21 LAB — HEPATITIS PANEL, ACUTE
HCV Ab: NONREACTIVE
Hep A IgM: NONREACTIVE
Hep B C IgM: NONREACTIVE
Hepatitis B Surface Ag: NONREACTIVE

## 2022-11-21 LAB — CARDIOLIPIN ANTIBODIES, IGG, IGM, IGA
Anticardiolipin IgA: <9 APL U/mL (ref 0–11)
Anticardiolipin IgG: <9 GPL U/mL (ref 0–14)
Anticardiolipin IgM: 14 MPL U/mL — ABNORMAL HIGH (ref 0–12)

## 2022-11-21 LAB — HOMOCYSTEINE: Homocysteine: 13.7 umol/L (ref 0.0–14.5)

## 2022-11-21 LAB — HEPARIN LEVEL (UNFRACTIONATED)
Heparin Unfractionated: 0.53 IU/mL (ref 0.30–0.70)
Heparin Unfractionated: 0.34 IU/mL (ref 0.30–0.70)

## 2022-11-21 LAB — BETA-2-GLYCOPROTEIN I ABS, IGG/M/A
Beta-2 Glyco I IgG: <9 GPI IgG units (ref 0–20)
Beta-2-Glycoprotein I IgA: <9 GPI IgA units (ref 0–25)
Beta-2-Glycoprotein I IgM: 15 GPI IgM units (ref 0–32)

## 2022-11-21 LAB — MAGNESIUM: Magnesium: 2 mg/dL (ref 1.7–2.4)

## 2022-11-21 MED ORDER — SODIUM CHLORIDE 0.9 % IV BOLUS
1000.0000 mL | Freq: Once | INTRAVENOUS | Status: AC
  Administered 2022-11-21: 1000 mL via INTRAVENOUS

## 2022-11-21 MED ORDER — PANTOPRAZOLE SODIUM 40 MG PO TBEC
40.0000 mg | DELAYED_RELEASE_TABLET | Freq: Every day | ORAL | Status: DC
  Administered 2022-11-22 – 2022-11-25 (×4): 40 mg via ORAL
  Filled 2022-11-21 (×4): qty 1

## 2022-11-21 MED ORDER — ORAL CARE MOUTH RINSE: 15.0000 mL | OROMUCOSAL | Status: DC | PRN

## 2022-11-21 MED ORDER — ACETAMINOPHEN 500 MG PO TABS: 500.0000 mg | ORAL_TABLET | Freq: Four times a day (QID) | ORAL | Status: DC | PRN

## 2022-11-21 MED ORDER — ACETAMINOPHEN 80 MG RE SUPP: 500.0000 mg | Freq: Four times a day (QID) | RECTAL | Status: DC | PRN

## 2022-11-21 NOTE — Consult Note (Addendum)
[facility]  INPATIENT CONSULTATION  Reason for Consultation: AKI, renal infarct Requesting Provider: Dr. Janee Morn  HPI: Alfred Gray is an 57 y.o. 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    Had a few days of left flank pain so came to ED yesterday and was found to have hypoperfusion of L kidney on non contrasted study followed up with L renal infarcts on CTA.  +hematuria on UA.  +HTN 160s -- known h/o HTN and off meds for a year.  TTE showed no clots.  On heparin gtt.  Cr on presentation 1.4 today 1.58.  Last known Cr 2013 normal.    Currently feeling ok.  No recently other symptoms.    Took a few NSAID recently no heavy use.  No personal or family h/o CKD/ESRD.  No h/o clotting disorder.    PMH: Past Medical History:  Diagnosis Date   HTN (hypertension) 11/20/2022   Pancreatitis    PSH: Past Surgical History:  Procedure Laterality Date   WRIST SURGERY     WRIST SURGERY      Past Medical History:  Diagnosis Date   HTN (hypertension) 11/20/2022   Pancreatitis     Medications:  I have reviewed the patient's current medications.  Medications Prior to Admission  Medication Sig Dispense Refill   ondansetron (ZOFRAN) 4 MG tablet Take 1 tablet (4 mg total) by mouth every 6 (six) hours as needed for nausea. 20 tablet 0   Docusate Sodium (DSS) 100 MG CAPS Take 100 mg by mouth 2 (two) times daily. (Patient not taking: Reported on 11/20/2022) 14 each 0   oxyCODONE-acetaminophen (PERCOCET/ROXICET) 5-325 MG per tablet Take 1 tablet by mouth every 6 (six) hours as needed. (Patient not taking: Reported on 11/20/2022) 30 tablet 0    ALLERGIES:  No Known Allergies  FAM HX: Family History  Problem Relation Age of Onset   Diabetes Mother    Diabetes Father    Dementia Father    Hypertension Other        Various family members    Social History:   reports that he has  been smoking cigarettes. He has been smoking an average of .3 packs per day. He does not have any smokeless tobacco history on file. He reports current drug use. Frequency: 14.00 times per week. Drug: Marijuana. He reports that he does not drink alcohol.  ROS: 12 system ROS neg except per HPI  Blood pressure (!) 142/74, pulse 67, temperature 99.4 F (37.4 C), temperature source Oral, resp. rate 19, height 5\' 11"  (1.803 m), weight 67.8 kg, SpO2 100 %. PHYSICAL EXAM: Gen: well appearing man lying in bed  Eyes: anicteric, glasses ENT: MMM Neck: supple CV: RRR, no murmurs  Abd:  soft, thin Back: clear lungs, L flank is location of pain GU: no foley Extr:  no edema Neuro: nonfocal Skin: no rashes or lesions   Results for orders placed or performed during the hospital encounter of 11/20/22 (from the past 48 hour(s))  Basic metabolic panel     Status: Abnormal   Collection Time: 11/20/22  8:36 AM  Result Value Ref Range   Sodium 135 135 - 145 mmol/L   Potassium 4.4 3.5 - 5.1 mmol/L   Chloride 100 98 - 111 mmol/L   CO2 25 22 - 32 mmol/L   Glucose, Bld 125 (H) 70 - 99 mg/dL    Comment:  Glucose reference range applies only to samples taken after fasting for at least 8 hours.   BUN 6 6 - 20 mg/dL   Creatinine, Ser 1.30 (H) 0.61 - 1.24 mg/dL   Calcium 9.4 8.9 - 86.5 mg/dL   GFR, Estimated 56 (L) >60 mL/min    Comment: (NOTE) Calculated using the CKD-EPI Creatinine Equation (2021)    Anion gap 10 5 - 15    Comment: Performed at [facility], [address] [place], [place] [address]  CBC     Status: Abnormal   Collection Time: 11/20/22  8:36 AM  Result Value Ref Range   WBC 9.6 4.0 - 10.5 K/uL   RBC 5.57 4.22 - 5.81 MIL/uL   Hemoglobin 17.1 (H) 13.0 - 17.0 g/dL   HCT 62.9 (H) 52.8 - 41.3 %   MCV 93.5 80.0 - 100.0 fL   MCH 30.7 26.0 - 34.0 pg   MCHC 32.8 30.0 - 36.0 g/dL   RDW 24.4 01.0 - 27.2 %   Platelets 275 150 - 400 K/uL   nRBC 0.0 0.0 - 0.2 %    Comment: Performed at  [facility], [address] [place], [place] [address]  Urinalysis, Routine w reflex microscopic -Urine, Clean Catch     Status: Abnormal   Collection Time: 11/20/22  1:13 PM  Result Value Ref Range   Color, Urine YELLOW YELLOW   APPearance HAZY (A) CLEAR   Specific Gravity, Urine 1.016 1.005 - 1.030   pH 5.0 5.0 - 8.0   Glucose, UA NEGATIVE NEGATIVE mg/dL   Hgb urine dipstick SMALL (A) NEGATIVE   Bilirubin Urine NEGATIVE NEGATIVE   Ketones, ur 5 (A) NEGATIVE mg/dL   Protein, ur 403 (A) NEGATIVE mg/dL   Nitrite NEGATIVE NEGATIVE   Leukocytes,Ua NEGATIVE NEGATIVE   RBC / HPF 0-5 0 - 5 RBC/hpf   WBC, UA 0-5 0 - 5 WBC/hpf   Bacteria, UA RARE (A) NONE SEEN   Squamous Epithelial / HPF 0-5 0 - 5 /HPF   Mucus PRESENT     Comment: Performed at [facility], [address] [place], [place] [address]  Lipase, blood     Status: None   Collection Time: 11/20/22  1:14 PM  Result Value Ref Range   Lipase 31 11 - 51 U/L    Comment: HEMOLYSIS AT THIS LEVEL MAY AFFECT RESULT Performed at [facility], [address], [place], [place] [address]   Hepatic function panel     Status: Abnormal   Collection Time: 11/20/22  1:14 PM  Result Value Ref Range   Total Protein 7.4 6.5 - 8.1 g/dL   Albumin 4.0 3.5 - 5.0 g/dL   AST 40 15 - 41 U/L    Comment: HEMOLYSIS AT THIS LEVEL MAY AFFECT RESULT   ALT 26 0 - 44 U/L    Comment: HEMOLYSIS AT THIS LEVEL MAY AFFECT RESULT   Alkaline Phosphatase 59 38 - 126 U/L   Total Bilirubin 1.4 (H) 0.3 - 1.2 mg/dL    Comment: HEMOLYSIS AT THIS LEVEL MAY AFFECT RESULT   Bilirubin, Direct 0.5 (H) 0.0 - 0.2 mg/dL    Comment: HEMOLYSIS AT THIS LEVEL MAY AFFECT RESULT   Indirect Bilirubin NOT CALCULATED 0.3 - 0.9 mg/dL    Comment: Performed at [facility], [address] [place], [place] [address]  Antithrombin III     Status: None   Collection Time: 11/20/22  6:52 PM  Result Value Ref Range   AntiThromb III Func 101 75 - 120 %    Comment:  Performed at [facility], [address] [place], [place] [address]  HIV Antibody (routine testing w rflx)     Status: None   Collection Time: 11/20/22  6:52 PM  Result Value Ref Range   HIV Screen 4th Generation wRfx Non Reactive Non Reactive    Comment: Performed at [facility], [address], [place], [place] [address]  Magnesium     Status: None   Collection Time: 11/21/22  2:05 AM  Result Value Ref Range   Magnesium 2.0 1.7 - 2.4 mg/dL    Comment: Performed at [facility], [address], [place], [place] [address]  CBC with Differential/Platelet     Status: Abnormal   Collection Time: 11/21/22  2:05 AM  Result Value Ref Range   WBC 13.1 (H) 4.0 - 10.5 K/uL   RBC 5.67 4.22 - 5.81 MIL/uL   Hemoglobin 17.8 (H) 13.0 - 17.0 g/dL   HCT 84.6 96.2 - 95.2 %   MCV 91.0 80.0 - 100.0 fL   MCH 31.4 26.0 - 34.0 pg   MCHC 34.5 30.0 - 36.0 g/dL   RDW 84.1 32.4 - 40.1 %   Platelets 230 150 - 400 K/uL   nRBC 0.0 0.0 - 0.2 %   Neutrophils Relative % 74 %   Neutro Abs 9.6 (H) 1.7 - 7.7 K/uL   Lymphocytes Relative 15 %   Lymphs Abs 1.9 0.7 - 4.0 K/uL   Monocytes Relative 11 %   Monocytes Absolute 1.5 (H) 0.1 - 1.0 K/uL   Eosinophils Relative 0 %   Eosinophils Absolute 0.0 0.0 - 0.5 K/uL   Basophils Relative 0 %   Basophils Absolute 0.1 0.0 - 0.1 K/uL   Immature Granulocytes 0 %   Abs Immature Granulocytes 0.05 0.00 - 0.07 K/uL    Comment: Performed at [facility], [address] [place], [place] [address]  Comprehensive metabolic panel     Status: Abnormal   Collection Time: 11/21/22  2:05 AM  Result Value Ref Range   Sodium 135 135 - 145 mmol/L   Potassium 4.4 3.5 - 5.1 mmol/L   Chloride 101 98 - 111 mmol/L   CO2 21 (L) 22 - 32 mmol/L   Glucose, Bld 113 (H) 70 - 99 mg/dL    Comment: Glucose reference range applies only to samples taken after fasting for at least 8 hours.   BUN 12 6 - 20 mg/dL   Creatinine, Ser 3.66 (H) 0.61 - 1.24 mg/dL   Calcium 9.4  8.9 - 44.0 mg/dL   Total Protein 7.1 6.5 - 8.1 g/dL   Albumin 3.7 3.5 - 5.0 g/dL   AST 347 (H) 15 - 41 U/L   ALT 94 (H) 0 - 44 U/L   Alkaline Phosphatase 57 38 - 126 U/L   Total Bilirubin 1.7 (H) 0.3 - 1.2 mg/dL   GFR, Estimated 50 (L) >60 mL/min    Comment: (NOTE) Calculated using the CKD-EPI Creatinine Equation (2021)    Anion gap 13 5 - 15    Comment: Performed at [facility], [address] [place], [place] [address]  Lipid panel     Status: Abnormal   Collection Time: 11/21/22  2:05 AM  Result Value Ref Range   Cholesterol 199 0 - 200 mg/dL   Triglycerides 63 <638 mg/dL   HDL 85 >  40 mg/dL   Total CHOL/HDL Ratio 2.3 RATIO   VLDL 13 0 - 40 mg/dL   LDL Cholesterol 244 (H) 0 - 99 mg/dL    Comment:        Total Cholesterol/HDL:CHD Risk Coronary Heart Disease Risk Table                     Men   Women  1/2 Average Risk   3.4   3.3  Average Risk       5.0   4.4  2 X Average Risk   9.6   7.1  3 X Average Risk  23.4   11.0        Use the calculated Patient Ratio above and the CHD Risk Table to determine the patient's CHD Risk.        ATP III CLASSIFICATION (LDL):  <100     mg/dL   Optimal  010-272  mg/dL   Near or Above                    Optimal  130-159  mg/dL   Borderline  536-644  mg/dL   High  >034     mg/dL   Very High Performed at [facility], [address] [place], [place] [address]   Heparin level (unfractionated)     Status: None   Collection Time: 11/21/22  2:05 AM  Result Value Ref Range   Heparin Unfractionated 0.53 0.30 - 0.70 IU/mL    Comment: (NOTE) The clinical reportable range upper limit is being lowered to >1.10 to align with the FDA approved guidance for the current laboratory assay.  If heparin results are below expected values, and patient dosage has  been confirmed, suggest follow up testing of antithrombin III levels. Performed at [facility], [address], [place], [place] [address]     DG Chest 2  View  Result Date: 11/21/2022 CLINICAL DATA:  Leukocytosis. EXAM: CHEST - 2 VIEW COMPARISON:  None Available. FINDINGS: The heart size is normal. Lungs are clear. Changes of COPD are noted. No focal airspace disease is present. No edema or effusion is present. Degenerative changes are present at the shoulders bilaterally, left greater than right. The visualized soft tissues and bony thorax are otherwise unremarkable. IMPRESSION: 1. No acute cardiopulmonary disease. 2. COPD. Electronically Signed   By: Marin Roberts M.D.   On: 11/21/2022 09:53   ECHOCARDIOGRAM COMPLETE  Result Date: 11/20/2022    ECHOCARDIOGRAM REPORT   Patient Name:   Alfred Gray Date of Exam: 11/20/2022 Medical Rec #:  [id number]        Height:       71.0 in Accession #:    [id number]       Weight:       186.9 lb Date of Birth:  Nov 01, 1965         BSA:          2.049 m Patient Age:    57 years         BP:           140/98 mmHg Patient Gender: M                HR:           61 bpm. Exam Location:  Inpatient Procedure: 2D Echo, Cardiac Doppler and Color Doppler Indications:    Chest pain  History:        Patient has  no prior history of Echocardiogram examinations.                 Signs/Symptoms:Chest Pain; Risk Factors:Current Smoker,                 Dyslipidemia and Hypertension.  Sonographer:    Dondra Prader RVT RCS Referring Phys: [id number] [facility] A SMOOT IMPRESSIONS  1. Left ventricular ejection fraction, by estimation, is 55 to 60%. The left ventricle has normal function. The left ventricle has no regional wall motion abnormalities. There is mild concentric left ventricular hypertrophy. Left ventricular diastolic parameters are consistent with Grade I diastolic dysfunction (impaired relaxation).  2. Right ventricular systolic function is normal. The right ventricular size is normal. Tricuspid regurgitation signal is inadequate for assessing PA pressure.  3. The mitral valve is normal in structure. No evidence of mitral valve  regurgitation. No evidence of mitral stenosis.  4. The aortic valve is tricuspid. There is mild calcification of the aortic valve. Aortic valve regurgitation is not visualized. Aortic valve sclerosis/calcification is present, without any evidence of aortic stenosis. Aortic valve area, by VTI measures  2.05 cm. Aortic valve mean gradient measures 5.0 mmHg. Aortic valve Vmax measures 1.34 m/s.  5. The inferior vena cava is normal in size with greater than 50% respiratory variability, suggesting right atrial pressure of 3 mmHg. FINDINGS  Left Ventricle: Left ventricular ejection fraction, by estimation, is 55 to 60%. The left ventricle has normal function. The left ventricle has no regional wall motion abnormalities. The left ventricular internal cavity size was normal in size. There is  mild concentric left ventricular hypertrophy. Left ventricular diastolic parameters are consistent with Grade I diastolic dysfunction (impaired relaxation). Normal left ventricular filling pressure. Right Ventricle: The right ventricular size is normal. No increase in right ventricular wall thickness. Right ventricular systolic function is normal. Tricuspid regurgitation signal is inadequate for assessing PA pressure. Left Atrium: Left atrial size was normal in size. Right Atrium: Right atrial size was normal in size. Pericardium: There is no evidence of pericardial effusion. Mitral Valve: The mitral valve is normal in structure. No evidence of mitral valve regurgitation. No evidence of mitral valve stenosis. Tricuspid Valve: The tricuspid valve is normal in structure. Tricuspid valve regurgitation is not demonstrated. No evidence of tricuspid stenosis. Aortic Valve: The aortic valve is tricuspid. There is mild calcification of the aortic valve. Aortic valve regurgitation is not visualized. Aortic valve sclerosis/calcification is present, without any evidence of aortic stenosis. Aortic valve mean gradient measures 5.0 mmHg. Aortic  valve peak gradient measures 7.2 mmHg. Aortic valve area, by VTI measures 2.05 cm. Pulmonic Valve: The pulmonic valve was normal in structure. Pulmonic valve regurgitation is trivial. No evidence of pulmonic stenosis. Aorta: The aortic root is normal in size and structure. Venous: The inferior vena cava is normal in size with greater than 50% respiratory variability, suggesting right atrial pressure of 3 mmHg. IAS/Shunts: No atrial level shunt detected by color flow Doppler.  LEFT VENTRICLE PLAX 2D LVIDd:         5.20 cm      Diastology LVIDs:         4.00 cm      LV e' medial:    3.64 cm/s LV PW:         1.30 cm      LV E/e' medial:  9.9 LV IVS:        1.30 cm      LV e' lateral:   5.43 cm/s LVOT  diam:     2.00 cm      LV E/e' lateral: 6.6 LV SV:         61 LV SV Index:   30 LVOT Area:     3.14 cm  LV Volumes (MOD) LV vol d, MOD A2C: 193.0 ml LV vol d, MOD A4C: 199.0 ml LV vol s, MOD A2C: 72.2 ml LV vol s, MOD A4C: 75.8 ml LV SV MOD A2C:     120.8 ml LV SV MOD A4C:     199.0 ml LV SV MOD BP:      122.7 ml RIGHT VENTRICLE             IVC RV Basal diam:  3.60 cm     IVC diam: 1.60 cm RV S prime:     12.90 cm/s TAPSE (M-mode): 2.4 cm LEFT ATRIUM             Index        RIGHT ATRIUM           Index LA diam:        3.80 cm 1.85 cm/m   RA Area:     15.50 cm LA Vol (A2C):   69.2 ml 33.77 ml/m  RA Volume:   40.50 ml  19.77 ml/m LA Vol (A4C):   47.5 ml 23.18 ml/m LA Biplane Vol: 60.7 ml 29.62 ml/m  AORTIC VALVE                     PULMONIC VALVE AV Area (Vmax):    2.07 cm      PV Vmax:          0.87 m/s AV Area (Vmean):   1.88 cm      PV Peak grad:     3.1 mmHg AV Area (VTI):     2.05 cm      PR End Diast Vel: 1.43 msec AV Vmax:           134.00 cm/s AV Vmean:          105.000 cm/s AV VTI:            0.298 m AV Peak Grad:      7.2 mmHg AV Mean Grad:      5.0 mmHg LVOT Vmax:         88.30 cm/s LVOT Vmean:        62.800 cm/s LVOT VTI:          0.194 m LVOT/AV VTI ratio: 0.65  AORTA Ao Root diam: 3.40 cm Ao Asc  diam:  3.30 cm Ao Arch diam: 2.7 cm MITRAL VALVE MV Area (PHT): 2.94 cm    SHUNTS MV Decel Time: 258 msec    Systemic VTI:  0.19 m MV E velocity: 36.00 cm/s  Systemic Diam: 2.00 cm MV A velocity: 74.40 cm/s MV E/A ratio:  0.48 Armanda Magic MD Electronically signed by Armanda Magic MD Signature Date/Time: 11/20/2022/4:40:03 PM    Final    CT Angio Chest/Abd/Pel for Dissection W and/or Wo Contrast  Result Date: 11/20/2022 CLINICAL DATA:  Arterial embolism suspected, non-extremity, determine source. EXAM: CT ANGIOGRAPHY CHEST, ABDOMEN AND PELVIS TECHNIQUE: Non-contrast CT of the chest was initially obtained. Multidetector CT imaging through the chest, abdomen and pelvis was performed using the standard protocol during bolus administration of intravenous contrast. Multiplanar reconstructed images and MIPs were obtained and reviewed to evaluate the vascular anatomy. RADIATION DOSE REDUCTION: This exam was performed according to the departmental  dose-optimization program which includes automated exposure control, adjustment of the mA and/or kV according to patient size and/or use of iterative reconstruction technique. CONTRAST:  OMNIPAQUE IOHEXOL 350 MG/ML SOLN COMPARISON:  CT abdomen and pelvis 11/20/2022 FINDINGS: CTA CHEST FINDINGS Cardiovascular: There is no evidence of a thoracic aortic intramural hematoma on noncontrast images. There is mild thoracic aortic atherosclerosis without evidence of a dissection or aneurysm. There is a common origin of the brachiocephalic and left common carotid arteries, a normal variant. No pulmonary arterial emboli are identified on this nondedicated study. The heart is borderline enlarged. There is no pericardial effusion. Mediastinum/Nodes: No enlarged axillary, mediastinal, or hilar lymph nodes. Unremarkable thyroid and esophagus. Lungs/Pleura: No pleural effusion or pneumothorax. No consolidation or suspicious nodule. Suspected small focus of mucous near the right mainstem  bronchus orifice. Musculoskeletal: No acute osseous abnormality or suspicious osseous lesion. Review of the MIP images confirms the above findings. CTA ABDOMEN AND PELVIS FINDINGS VASCULAR Aorta: Mild soft and calcified plaque, predominantly in the infrarenal aorta. No dissection, aneurysm, or significant stenosis. Celiac: Patent without evidence of aneurysm, dissection, vasculitis or significant stenosis. SMA: Patent without evidence of aneurysm, dissection, vasculitis or significant stenosis. Renals: Both renal arteries are patent proximally without evidence of aneurysm, dissection, or fibromuscular dysplasia. There is occlusion of multiple small, distal left renal artery branch vessels. IMA: Patent without evidence of aneurysm, dissection, vasculitis or significant stenosis. Inflow: Moderate atherosclerosis without evidence of a significant stenosis, dissection, or aneurysm. Veins: No obvious venous abnormality within the limitations of this arterial phase study. Review of the MIP images confirms the above findings. NON-VASCULAR Hepatobiliary: Multiple small hypodense liver lesions with the largest measuring 1.5 cm and likely reflective of a cyst and with the majority of the other lesions being subcentimeter in size and too small to fully characterize. Unremarkable gallbladder. No biliary dilatation. Pancreas: Unremarkable. Spleen: Unremarkable. Adrenals/Urinary Tract: Unremarkable adrenal glands. Small focus of cortical scarring laterally in the mid inferior right kidney. Multiple large wedge-shaped regions of absent enhancement in the left kidney, similar to today's earlier CT. No hydronephrosis. Excreted contrast from today's earlier CT in the right renal collecting system, right ureter, and bladder. Stomach/Bowel: The stomach is grossly unremarkable. There is extensive left-sided colonic diverticulosis without evidence of acute diverticulitis. There is no evidence of bowel obstruction. The appendix is  unremarkable. Lymphatic: No enlarged lymph nodes in the abdomen or pelvis. Reproductive: Unremarkable prostate. Other: No ascites or pneumoperitoneum. Musculoskeletal: No acute osseous abnormality or suspicious osseous lesion. Review of the MIP images confirms the above findings. IMPRESSION: 1. 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 Wide patency of the proximal left renal artery. 2. Aortic atherosclerosis without dissection or aneurysm. 3. Colonic diverticulosis. Aortic Atherosclerosis (ICD10-I70.0). Electronically Signed   By: Sebastian Ache M.D.   On: 11/20/2022 15:58   CT ABDOMEN PELVIS W CONTRAST  Result Date: 11/20/2022 CLINICAL DATA:  Abdominal pain EXAM: CT ABDOMEN AND PELVIS WITH CONTRAST TECHNIQUE: Multidetector CT imaging of the abdomen and pelvis was performed using the standard protocol following bolus administration of intravenous contrast. RADIATION DOSE REDUCTION: This exam was performed according to the departmental dose-optimization program which includes automated exposure control, adjustment of the mA and/or kV according to patient size and/or use of iterative reconstruction technique. CONTRAST:  75mL OMNIPAQUE IOHEXOL 350 MG/ML SOLN COMPARISON:  None Available. FINDINGS: Lower chest: No focal infiltrates are seen in lower lung fields. Hepatobiliary: There are a few low-density foci in liver lesion  measuring less than 1.5 cm. There is no dilation of bile ducts. Gallbladder is not distended. Pancreas: No focal abnormalities are seen. Spleen: Unremarkable. Adrenals/Urinary Tract: Adrenals are unremarkable. There is no hydronephrosis. There are multiple large areas of decreased enhancement in left kidney. There is no significant perinephric stranding. As far as seen, main left renal artery is patent. Left atrium and left ventricle are not included in their entirety for evaluation. There are no renal or ureteral stones. There is focal cortical  thinning in the lateral aspect of lower pole of right kidney. Ureters are not dilated. Urinary bladder is unremarkable. Stomach/Bowel: Stomach is not distended. There is mild diffuse mucosal enhancement in stomach. Small bowel loops are not dilated. Appendix is not dilated. There is no significant wall thickening in colon. Scattered diverticula are seen in colon without signs of focal acute diverticulitis. Vascular/Lymphatic: There are scattered atherosclerotic plaques and calcifications in aorta and its major branches. Reproductive: There are coarse calcifications in prostate. Other: There is no ascites or pneumoperitoneum. Umbilical hernia containing fat is seen. Musculoskeletal: No acute findings are seen. Degenerative changes are noted in lumbar spine, more so at the L4-L5 and L5-S1 levels with encroachment of neural foramina. IMPRESSION: There are multiple patchy foci of decreased or absent perfusion in the left kidney. This may suggest acute pyelonephritis. Another possibility would be multiple emboli in peripheral left renal artery branches from central cardiac source. Please correlate with clinical and laboratory findings. If there is clinical suspicion for arterial emboli arising from the heart, echocardiogram may be considered. There is no evidence of intestinal obstruction or pneumoperitoneum. Appendix is not dilated. There is no hydronephrosis. There is mild diffuse mucosal enhancement in the stomach which may be an artifact caused by incomplete distention although suggest gastritis. There are multiple small low-density foci in liver, possibly cysts or hemangiomas. Diverticulosis of colon without signs of focal acute diverticulitis. Electronically Signed   By: Ernie Avena M.D.   On: 11/20/2022 13:40    Assessment/PlanCharlie T Feiock is an 57 y.o. 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   **renal  infarct:  distribution makes it look embolic.  TTE unrevealing, poss TEE to be pursued.  On heparin, will transition to PO if procedures not necessary. Presentation not consistent with vasculitis.  Hypercoag w/u underway.  Nothing to add to w/u.   **AKI:  Cr 1.4 on presentation, no recent values for comparison. May have some CKD related to untreated HTN.  Up to 1.58 after IV contrast administration.  UA + blood +1 protein -- suspect secondary to infarct, do not think GN.   Looks euvolemic.  Avoid nephrotoxins.  Will follow labs, I/Os.   Has underlying risk factor of HTN.    **HTN: was not taking meds x 2y.  Started amlodipine here - AM BP 130s which is acceptable for now.  Avoid hypotension with AKI.    **Tobacco use: cessation recommended  **HTN:  statin  Will follow.  Tyler Pita 11/21/2022, 10:45 AM

## 2022-11-21 NOTE — Progress Notes (Signed)
PROGRESS NOTE    Alfred Gray  EAV:[id number] DOB: [birth year] DOA: 11/20/2022 PCP: Pcp, No    Chief Complaint  Patient presents with   Back Pain   Nausea    Brief Narrative:  Patient 57 year old gentleman history of ongoing tobacco use, hypertension, hyperlipidemia presented to ED with 2 to 3-day history of left flank pain and nausea.  Patient underwent CT angiogram chest abdomen and pelvis, CT abdomen and pelvis which was concerning for left renal infarcts.  2D echo done with normal EF, no source of emboli noted.  Hypercoagulable panel ordered.  Patient placed on IV heparin.  Vascular surgery and nephrology consulted.   Assessment & Plan:   Principal Problem:   Renal infarct Active Problems:   Tobacco abuse   HTN (hypertension)   Hyperlipidemia   Transaminitis   AKI (acute kidney injury)  #1 left renal infarct -Patient presented with left CVA tenderness, nausea. -Urinalysis negative for any acute infection. 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  Wide patency of proximal left renal artery.  Aortic atherosclerosis without dissection or aneurysm noted. -??  Etiology.  Patient with no history of A-fib. -CT angiogram chest done negative for pulmonary emboli. -2D echo done with normal EF, NWMA, grade 1 diastolic dysfunction, no emboli noted. -Continue telemetry to monitor for A-fib.   -Hypercoagulable panel ordered and pending with a homocystine level of 13.7, cardiolipin antibody IgG IgM negative, IgM at 14.  Antithrombin activity of 101.  Beta-2 glycoprotein negative. -Prothrombin gene mutation pending.  Factor V Leiden pending.  Lupus anticoagulant pending.  Protein S protein C pending. -Patient seen in consultation by vascular surgery who feel no surgical intervention at this time and recommending possibility of a TEE for further evaluation  and continuation of anticoagulation. -Patient also seen in consultation by nephrology. -Continue IV heparin. -Statin discontinued due to transaminitis this morning.  2.  Acute kidney injury -Creatinine slowly trending up currently at 1.58 from 1.43 on admission.  Last creatinine noted on epic was 0.86 on 05/15/2012. -Likely secondary to renal infarct in the setting of possible dehydration. -Urinalysis nitrite negative leukocytes negative small hemoglobin, 100 protein, 0-5 WBCs. -Patient noted to have received IV contrast on admission which may be contributing to rising creatinine. -IV fluids. -Avoid nephrotoxins.   3.  Hypertension -Patient states has not taken his blood pressure medications in about 2 months.  States has not seen his PCP in over a year however does endorse that he has his antihypertensive medications at home however cannot remember the name. -BP improved with starting patient on Norvasc 10 mg daily.   -IV hydralazine as needed.    4.  History of hyperlipidemia -Fasting lipid panel with LDL of 101, total cholesterol of 199.   -Patient was started on Lipitor however due to transaminitis we will hold statin for now.  5.  Transaminitis -Patient noted with a transaminitis today which was normal on admission. -CT abdomen and pelvis concerning for cyst versus hemangiomas in the liver. -Acute hepatitis panel ordered this morning negative. -IV fluids. -Repeat labs in the AM.   6.  Tobacco abuse -Tobacco cessation stressed to patient. -Patient at this time declining a nicotine patch.     DVT prophylaxis: Heparin Code Status: Full Family Communication: Updated patient.  No family at bedside. Disposition: Home once workup is completed and clinical improvement.  Status is: Inpatient  The patient will require care spanning > 2 midnights and should be moved to inpatient because: Severity of illness   Consultants:  Vascular surgery: Dr. Lenell Antu 11/20/2022 Nephrology: Dr.  Glenna Fellows 11/21/2022   Procedures:  CT angiogram chest abdomen and pelvis 11/20/2022 CT abdomen and pelvis 11/20/2022 Chest x-ray 11/21/2022 2D echo 11/20/2022   Antimicrobials:  None   Subjective: Patient laying in bed states left flank pain improving since admission.  No chest pain.  No shortness of breath.  No bleeding.  Objective: Vitals:   11/20/22 2323 11/21/22 0540 11/21/22 0744 11/21/22 1817  BP: (!) 141/94 (!) 161/89 (!) 142/74   Pulse: 72 74 67   Resp: 12 14 19 18   Temp: 98.1 F (36.7 C) 98.6 F (37 C) 99.4 F (37.4 C)   TempSrc: Oral Oral Oral   SpO2: 100% 100% 100%   Weight:  67.8 kg    Height:        Intake/Output Summary (Last 24 hours) at 11/21/2022 1819 Last data filed at 11/21/2022 1400 Gross per 24 hour  Intake 2948.53 ml  Output 750 ml  Net 2198.53 ml   Filed Weights   11/20/22 0828 11/21/22 0540  Weight: 84.8 kg 67.8 kg    Examination:  General exam: Appears calm and comfortable  Respiratory system: Clear to auscultation.  No wheezes, no crackles, no rhonchi.  Fair air movement.  Speaking in full sentences.  Respiratory effort normal. Cardiovascular system: S1 & S2 heard, RRR. No JVD, murmurs, rubs, gallops or clicks. No pedal edema. Gastrointestinal system: Abdomen is nondistended, soft and nontender.  Decreasing left CVA TTP.  Normal bowel sounds heard. Central nervous system: Alert and oriented. No focal neurological deficits. Extremities: Symmetric 5 x 5 power. Skin: No rashes, lesions or ulcers Psychiatry: Judgement and insight appear normal. Mood & affect appropriate.     Data Reviewed: I have personally reviewed following labs and imaging studies  CBC: Recent Labs  Lab 11/20/22 0836 11/21/22 0205  WBC 9.6 13.1*  NEUTROABS  --  9.6*  HGB 17.1* 17.8*  HCT 52.1* 51.6  MCV 93.5 91.0  PLT 275 230    Basic Metabolic Panel: Recent Labs  Lab 11/20/22 0836 11/21/22 0205  NA 135 135  K 4.4 4.4  CL 100 101  CO2 25 21*  GLUCOSE  125* 113*  BUN 6 12  CREATININE 1.43* 1.58*  CALCIUM 9.4 9.4  MG  --  2.0    GFR: Estimated Creatinine Clearance: 48.3 mL/min (A) (by C-G formula based on SCr of 1.58 mg/dL (H)).  Liver Function Tests: Recent Labs  Lab 11/20/22 1314 11/21/22 0205  AST 40 159*  ALT 26 94*  ALKPHOS 59 57  BILITOT 1.4* 1.7*  PROT 7.4 7.1  ALBUMIN 4.0 3.7    CBG: No results for input(s): "GLUCAP" in the last 168 hours.   No results found for this or any previous visit (from the past 240 hour(s)).       Radiology Studies: DG Chest 2 View  Result Date: 11/21/2022 CLINICAL DATA:  Leukocytosis. EXAM: CHEST - 2 VIEW COMPARISON:  None Available. FINDINGS: The heart size is normal. Lungs are clear. Changes of COPD are noted. No focal airspace disease is present. No edema or effusion is present. Degenerative changes are present at the shoulders bilaterally, left greater than right. The visualized soft tissues and bony thorax are otherwise unremarkable. IMPRESSION: 1. No acute cardiopulmonary disease. 2. COPD. Electronically Signed   By: Marin Roberts M.D.   On:  11/21/2022 09:53   ECHOCARDIOGRAM COMPLETE  Result Date: 11/20/2022    ECHOCARDIOGRAM REPORT   Patient Name:   DAVID BALDI Date of Exam: 11/20/2022 Medical Rec #:  [id number]        Height:       71.0 in Accession #:    [id number]       Weight:       186.9 lb Date of Birth:  [birth year]         BSA:          2.049 m Patient Age:    57 years         BP:           140/98 mmHg Patient Gender: M                HR:           61 bpm. Exam Location:  Inpatient Procedure: 2D Echo, Cardiac Doppler and Color Doppler Indications:    Chest pain  History:        Patient has no prior history of Echocardiogram examinations.                 Signs/Symptoms:Chest Pain; Risk Factors:Current Smoker,                 Dyslipidemia and Hypertension.  Sonographer:    Dondra Prader RVT RCS Referring Phys: [id number] [facility] A SMOOT IMPRESSIONS  1. Left ventricular ejection  fraction, by estimation, is 55 to 60%. The left ventricle has normal function. The left ventricle has no regional wall motion abnormalities. There is mild concentric left ventricular hypertrophy. Left ventricular diastolic parameters are consistent with Grade I diastolic dysfunction (impaired relaxation).  2. Right ventricular systolic function is normal. The right ventricular size is normal. Tricuspid regurgitation signal is inadequate for assessing PA pressure.  3. The mitral valve is normal in structure. No evidence of mitral valve regurgitation. No evidence of mitral stenosis.  4. The aortic valve is tricuspid. There is mild calcification of the aortic valve. Aortic valve regurgitation is not visualized. Aortic valve sclerosis/calcification is present, without any evidence of aortic stenosis. Aortic valve area, by VTI measures  2.05 cm. Aortic valve mean gradient measures 5.0 mmHg. Aortic valve Vmax measures 1.34 m/s.  5. The inferior vena cava is normal in size with greater than 50% respiratory variability, suggesting right atrial pressure of 3 mmHg. FINDINGS  Left Ventricle: Left ventricular ejection fraction, by estimation, is 55 to 60%. The left ventricle has normal function. The left ventricle has no regional wall motion abnormalities. The left ventricular internal cavity size was normal in size. There is  mild concentric left ventricular hypertrophy. Left ventricular diastolic parameters are consistent with Grade I diastolic dysfunction (impaired relaxation). Normal left ventricular filling pressure. Right Ventricle: The right ventricular size is normal. No increase in right ventricular wall thickness. Right ventricular systolic function is normal. Tricuspid regurgitation signal is inadequate for assessing PA pressure. Left Atrium: Left atrial size was normal in size. Right Atrium: Right atrial size was normal in size. Pericardium: There is no evidence of pericardial effusion. Mitral Valve: The mitral  valve is normal in structure. No evidence of mitral valve regurgitation. No evidence of mitral valve stenosis. Tricuspid Valve: The tricuspid valve is normal in structure. Tricuspid valve regurgitation is not demonstrated. No evidence of tricuspid stenosis. Aortic Valve: The aortic valve is tricuspid. There is mild calcification of the aortic valve. Aortic valve regurgitation is not visualized. Aortic valve sclerosis/calcification  is present, without any evidence of aortic stenosis. Aortic valve mean gradient measures 5.0 mmHg. Aortic valve peak gradient measures 7.2 mmHg. Aortic valve area, by VTI measures 2.05 cm. Pulmonic Valve: The pulmonic valve was normal in structure. Pulmonic valve regurgitation is trivial. No evidence of pulmonic stenosis. Aorta: The aortic root is normal in size and structure. Venous: The inferior vena cava is normal in size with greater than 50% respiratory variability, suggesting right atrial pressure of 3 mmHg. IAS/Shunts: No atrial level shunt detected by color flow Doppler.  LEFT VENTRICLE PLAX 2D LVIDd:         5.20 cm      Diastology LVIDs:         4.00 cm      LV e' medial:    3.64 cm/s LV PW:         1.30 cm      LV E/e' medial:  9.9 LV IVS:        1.30 cm      LV e' lateral:   5.43 cm/s LVOT diam:     2.00 cm      LV E/e' lateral: 6.6 LV SV:         61 LV SV Index:   30 LVOT Area:     3.14 cm  LV Volumes (MOD) LV vol d, MOD A2C: 193.0 ml LV vol d, MOD A4C: 199.0 ml LV vol s, MOD A2C: 72.2 ml LV vol s, MOD A4C: 75.8 ml LV SV MOD A2C:     120.8 ml LV SV MOD A4C:     199.0 ml LV SV MOD BP:      122.7 ml RIGHT VENTRICLE             IVC RV Basal diam:  3.60 cm     IVC diam: 1.60 cm RV S prime:     12.90 cm/s TAPSE (M-mode): 2.4 cm LEFT ATRIUM             Index        RIGHT ATRIUM           Index LA diam:        3.80 cm 1.85 cm/m   RA Area:     15.50 cm LA Vol (A2C):   69.2 ml 33.77 ml/m  RA Volume:   40.50 ml  19.77 ml/m LA Vol (A4C):   47.5 ml 23.18 ml/m LA Biplane Vol: 60.7  ml 29.62 ml/m  AORTIC VALVE                     PULMONIC VALVE AV Area (Vmax):    2.07 cm      PV Vmax:          0.87 m/s AV Area (Vmean):   1.88 cm      PV Peak grad:     3.1 mmHg AV Area (VTI):     2.05 cm      PR End Diast Vel: 1.43 msec AV Vmax:           134.00 cm/s AV Vmean:          105.000 cm/s AV VTI:            0.298 m AV Peak Grad:      7.2 mmHg AV Mean Grad:      5.0 mmHg LVOT Vmax:         88.30 cm/s LVOT Vmean:        62.800 cm/s  LVOT VTI:          0.194 m LVOT/AV VTI ratio: 0.65  AORTA Ao Root diam: 3.40 cm Ao Asc diam:  3.30 cm Ao Arch diam: 2.7 cm MITRAL VALVE MV Area (PHT): 2.94 cm    SHUNTS MV Decel Time: 258 msec    Systemic VTI:  0.19 m MV E velocity: 36.00 cm/s  Systemic Diam: 2.00 cm MV A velocity: 74.40 cm/s MV E/A ratio:  0.48 Armanda Magic MD Electronically signed by Armanda Magic MD Signature Date/Time: 11/20/2022/4:40:03 PM    Final    CT Angio Chest/Abd/Pel for Dissection W and/or Wo Contrast  Result Date: 11/20/2022 CLINICAL DATA:  Arterial embolism suspected, non-extremity, determine source. EXAM: CT ANGIOGRAPHY CHEST, ABDOMEN AND PELVIS TECHNIQUE: Non-contrast CT of the chest was initially obtained. Multidetector CT imaging through the chest, abdomen and pelvis was performed using the standard protocol during bolus administration of intravenous contrast. Multiplanar reconstructed images and MIPs were obtained and reviewed to evaluate the vascular anatomy. RADIATION DOSE REDUCTION: This exam was performed according to the departmental dose-optimization program which includes automated exposure control, adjustment of the mA and/or kV according to patient size and/or use of iterative reconstruction technique. CONTRAST:  OMNIPAQUE IOHEXOL 350 MG/ML SOLN COMPARISON:  CT abdomen and pelvis 11/20/2022 FINDINGS: CTA CHEST FINDINGS Cardiovascular: There is no evidence of a thoracic aortic intramural hematoma on noncontrast images. There is mild thoracic aortic atherosclerosis  without evidence of a dissection or aneurysm. There is a common origin of the brachiocephalic and left common carotid arteries, a normal variant. No pulmonary arterial emboli are identified on this nondedicated study. The heart is borderline enlarged. There is no pericardial effusion. Mediastinum/Nodes: No enlarged axillary, mediastinal, or hilar lymph nodes. Unremarkable thyroid and esophagus. Lungs/Pleura: No pleural effusion or pneumothorax. No consolidation or suspicious nodule. Suspected small focus of mucous near the right mainstem bronchus orifice. Musculoskeletal: No acute osseous abnormality or suspicious osseous lesion. Review of the MIP images confirms the above findings. CTA ABDOMEN AND PELVIS FINDINGS VASCULAR Aorta: Mild soft and calcified plaque, predominantly in the infrarenal aorta. No dissection, aneurysm, or significant stenosis. Celiac: Patent without evidence of aneurysm, dissection, vasculitis or significant stenosis. SMA: Patent without evidence of aneurysm, dissection, vasculitis or significant stenosis. Renals: Both renal arteries are patent proximally without evidence of aneurysm, dissection, or fibromuscular dysplasia. There is occlusion of multiple small, distal left renal artery branch vessels. IMA: Patent without evidence of aneurysm, dissection, vasculitis or significant stenosis. Inflow: Moderate atherosclerosis without evidence of a significant stenosis, dissection, or aneurysm. Veins: No obvious venous abnormality within the limitations of this arterial phase study. Review of the MIP images confirms the above findings. NON-VASCULAR Hepatobiliary: Multiple small hypodense liver lesions with the largest measuring 1.5 cm and likely reflective of a cyst and with the majority of the other lesions being subcentimeter in size and too small to fully characterize. Unremarkable gallbladder. No biliary dilatation. Pancreas: Unremarkable. Spleen: Unremarkable. Adrenals/Urinary Tract:  Unremarkable adrenal glands. Small focus of cortical scarring laterally in the mid inferior right kidney. Multiple large wedge-shaped regions of absent enhancement in the left kidney, similar to today's earlier CT. No hydronephrosis. Excreted contrast from today's earlier CT in the right renal collecting system, right ureter, and bladder. Stomach/Bowel: The stomach is grossly unremarkable. There is extensive left-sided colonic diverticulosis without evidence of acute diverticulitis. There is no evidence of bowel obstruction. The appendix is unremarkable. Lymphatic: No enlarged lymph nodes in the abdomen or pelvis. Reproductive: Unremarkable prostate. Other: No  ascites or pneumoperitoneum. Musculoskeletal: No acute osseous abnormality or suspicious osseous lesion. Review of the MIP images confirms the above findings. IMPRESSION: 1. 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 Wide patency of the proximal left renal artery. 2. Aortic atherosclerosis without dissection or aneurysm. 3. Colonic diverticulosis. Aortic Atherosclerosis (ICD10-I70.0). Electronically Signed   By: Sebastian Ache M.D.   On: 11/20/2022 15:58   CT ABDOMEN PELVIS W CONTRAST  Result Date: 11/20/2022 CLINICAL DATA:  Abdominal pain EXAM: CT ABDOMEN AND PELVIS WITH CONTRAST TECHNIQUE: Multidetector CT imaging of the abdomen and pelvis was performed using the standard protocol following bolus administration of intravenous contrast. RADIATION DOSE REDUCTION: This exam was performed according to the departmental dose-optimization program which includes automated exposure control, adjustment of the mA and/or kV according to patient size and/or use of iterative reconstruction technique. CONTRAST:  75mL OMNIPAQUE IOHEXOL 350 MG/ML SOLN COMPARISON:  None Available. FINDINGS: Lower chest: No focal infiltrates are seen in lower lung fields. Hepatobiliary: There are a few low-density foci in liver lesion measuring  less than 1.5 cm. There is no dilation of bile ducts. Gallbladder is not distended. Pancreas: No focal abnormalities are seen. Spleen: Unremarkable. Adrenals/Urinary Tract: Adrenals are unremarkable. There is no hydronephrosis. There are multiple large areas of decreased enhancement in left kidney. There is no significant perinephric stranding. As far as seen, main left renal artery is patent. Left atrium and left ventricle are not included in their entirety for evaluation. There are no renal or ureteral stones. There is focal cortical thinning in the lateral aspect of lower pole of right kidney. Ureters are not dilated. Urinary bladder is unremarkable. Stomach/Bowel: Stomach is not distended. There is mild diffuse mucosal enhancement in stomach. Small bowel loops are not dilated. Appendix is not dilated. There is no significant wall thickening in colon. Scattered diverticula are seen in colon without signs of focal acute diverticulitis. Vascular/Lymphatic: There are scattered atherosclerotic plaques and calcifications in aorta and its major branches. Reproductive: There are coarse calcifications in prostate. Other: There is no ascites or pneumoperitoneum. Umbilical hernia containing fat is seen. Musculoskeletal: No acute findings are seen. Degenerative changes are noted in lumbar spine, more so at the L4-L5 and L5-S1 levels with encroachment of neural foramina. IMPRESSION: There are multiple patchy foci of decreased or absent perfusion in the left kidney. This may suggest acute pyelonephritis. Another possibility would be multiple emboli in peripheral left renal artery branches from central cardiac source. Please correlate with clinical and laboratory findings. If there is clinical suspicion for arterial emboli arising from the heart, echocardiogram may be considered. There is no evidence of intestinal obstruction or pneumoperitoneum. Appendix is not dilated. There is no hydronephrosis. There is mild diffuse  mucosal enhancement in the stomach which may be an artifact caused by incomplete distention although suggest gastritis. There are multiple small low-density foci in liver, possibly cysts or hemangiomas. Diverticulosis of colon without signs of focal acute diverticulitis. Electronically Signed   By: Ernie Avena M.D.   On: 11/20/2022 13:40        Scheduled Meds:  amLODipine  10 mg Oral Daily   pantoprazole  40 mg Oral Q0600   sodium chloride flush  3 mL Intravenous Q12H   Continuous Infusions:  sodium chloride 125 mL/hr at 11/21/22 1041   heparin 1,300 Units/hr (11/21/22 1044)     LOS: 0 days    Time spent: 40 minutes    Ramiro Harvest, MD [facility]  To contact the attending provider between 7A-7P or the covering provider during after hours 7P-7A, please log into the web site [email or web address] and access using universal [employer] password for that web site. If you do not have the password, please call the hospital operator.  11/21/2022, 6:19 PM

## 2022-11-21 NOTE — Progress Notes (Signed)
ANTICOAGULATION CONSULT NOTE   Pharmacy Consult for Heparin Indication:  renal infarcts  No Known Allergies  Patient Measurements: Height: 5\' 11"  (180.3 cm) Weight: 67.8 kg (149 lb 7.6 oz) IBW/kg (Calculated) : 75.3 Heparin Dosing Weight: 85 kg  Vital Signs: Temp: 99.4 F (37.4 C) (04/12 0744) Temp Source: Oral (04/12 0744) BP: 142/74 (04/12 0744) Pulse Rate: 67 (04/12 0744)  Labs: Recent Labs    11/20/22 0836 11/21/22 0205 11/21/22 1004  HGB 17.1* 17.8*  --   HCT 52.1* 51.6  --   PLT 275 230  --   HEPARINUNFRC  --  0.53 0.34  CREATININE 1.43* 1.58*  --      Estimated Creatinine Clearance: 48.3 mL/min (A) (by C-G formula based on SCr of 1.58 mg/dL (H)).   Medical History: Past Medical History:  Diagnosis Date   HTN (hypertension) 11/20/2022   Pancreatitis     Medications:  Medications Prior to Admission  Medication Sig Dispense Refill Last Dose   ondansetron (ZOFRAN) 4 MG tablet Take 1 tablet (4 mg total) by mouth every 6 (six) hours as needed for nausea. 20 tablet 0 11/20/2022   Docusate Sodium (DSS) 100 MG CAPS Take 100 mg by mouth 2 (two) times daily. (Patient not taking: Reported on 11/20/2022) 14 each 0 Not Taking   oxyCODONE-acetaminophen (PERCOCET/ROXICET) 5-325 MG per tablet Take 1 tablet by mouth every 6 (six) hours as needed. (Patient not taking: Reported on 11/20/2022) 30 tablet 0 Not Taking    Assessment: 57 y.o. M presents with flank pain and nausea. 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 Hypercoagulable labs pending. Pharmacy dosing heparin  -heparin at goal (0.34) on 1300 units/hr -plans noted for oral anticoagulation when work-up is complete. Work is underway to get Xarelto approved by his insurance (Eliquis is non-formulary)  Goal of Therapy:  Heparin level 0.3-0.7 units/ml Monitor platelets by anticoagulation protocol: Yes   Plan:  -Continue heparin at 1300 units/hr -Daily heparin level and CBC  Harland German, PharmD Clinical  Pharmacist **Pharmacist phone directory can now be found on [email or web address] ([email or web address]).  Listed under [facility].

## 2022-11-21 NOTE — Progress Notes (Signed)
ANTICOAGULATION CONSULT NOTE - Follow Up Consult  Pharmacy Consult for heparin Indication:  renal infarct  Labs: Recent Labs    11/20/22 0836 11/21/22 0205  HGB 17.1* 17.8*  HCT 52.1* 51.6  PLT 275 230  HEPARINUNFRC  --  0.53  CREATININE 1.43* 1.58*    Assessment/Plan:  57yo male therapeutic on heparin with initial dosing for renal infarct. Will continue infusion at current rate of 1300 units/hr and confirm stable with additional level.   Vernard Gambles, PharmD, BCPS  11/21/2022,3:21 AM

## 2022-11-22 DIAGNOSIS — D72829 Elevated white blood cell count, unspecified: Secondary | ICD-10-CM

## 2022-11-22 LAB — CBC WITH DIFFERENTIAL/PLATELET
Abs Immature Granulocytes: 0.05 10*3/uL (ref 0.00–0.07)
Basophils Absolute: 0 10*3/uL (ref 0.0–0.1)
Basophils Relative: 0 %
Eosinophils Absolute: 0 10*3/uL (ref 0.0–0.5)
Eosinophils Relative: 0 %
HCT: 40.9 % (ref 39.0–52.0)
Hemoglobin: 13.8 g/dL (ref 13.0–17.0)
Immature Granulocytes: 0 %
Lymphocytes Relative: 8 %
Lymphs Abs: 1.2 10*3/uL (ref 0.7–4.0)
MCH: 30.9 pg (ref 26.0–34.0)
MCHC: 33.7 g/dL (ref 30.0–36.0)
MCV: 91.5 fL (ref 80.0–100.0)
Monocytes Absolute: 2.2 10*3/uL — ABNORMAL HIGH (ref 0.1–1.0)
Monocytes Relative: 14 %
Neutro Abs: 12.1 10*3/uL — ABNORMAL HIGH (ref 1.7–7.7)
Neutrophils Relative %: 78 %
Platelets: 157 10*3/uL (ref 150–400)
RBC: 4.47 MIL/uL (ref 4.22–5.81)
RDW: 14.3 % (ref 11.5–15.5)
WBC: 15.5 10*3/uL — ABNORMAL HIGH (ref 4.0–10.5)
nRBC: 0 % (ref 0.0–0.2)

## 2022-11-22 LAB — COMPREHENSIVE METABOLIC PANEL
ALT: 81 U/L — ABNORMAL HIGH (ref 0–44)
AST: 78 U/L — ABNORMAL HIGH (ref 15–41)
Albumin: 2.7 g/dL — ABNORMAL LOW (ref 3.5–5.0)
Alkaline Phosphatase: 46 U/L (ref 38–126)
Anion gap: 8 (ref 5–15)
BUN: 17 mg/dL (ref 6–20)
CO2: 21 mmol/L — ABNORMAL LOW (ref 22–32)
Calcium: 8.4 mg/dL — ABNORMAL LOW (ref 8.9–10.3)
Chloride: 104 mmol/L (ref 98–111)
Creatinine, Ser: 1.59 mg/dL — ABNORMAL HIGH (ref 0.61–1.24)
GFR, Estimated: 50 mL/min — ABNORMAL LOW (ref >60)
Glucose, Bld: 115 mg/dL — ABNORMAL HIGH (ref 70–99)
Potassium: 3.7 mmol/L (ref 3.5–5.1)
Sodium: 133 mmol/L — ABNORMAL LOW (ref 135–145)
Total Bilirubin: 1 mg/dL (ref 0.3–1.2)
Total Protein: 5.6 g/dL — ABNORMAL LOW (ref 6.5–8.1)

## 2022-11-22 LAB — MAGNESIUM: Magnesium: 1.9 mg/dL (ref 1.7–2.4)

## 2022-11-22 LAB — LUPUS ANTICOAGULANT PANEL
DRVVT: 34.1 s (ref 0.0–47.0)
PTT Lupus Anticoagulant: 31.2 s (ref 0.0–43.5)

## 2022-11-22 LAB — HEPARIN LEVEL (UNFRACTIONATED)
Heparin Unfractionated: <0.1 IU/mL — ABNORMAL LOW (ref 0.30–0.70)
Heparin Unfractionated: 0.14 IU/mL — ABNORMAL LOW (ref 0.30–0.70)
Heparin Unfractionated: 0.29 IU/mL — ABNORMAL LOW (ref 0.30–0.70)

## 2022-11-22 LAB — PROTEIN C ACTIVITY: Protein C Activity: 93 % (ref 73–180)

## 2022-11-22 LAB — PROTEIN S, TOTAL: Protein S Ag, Total: 83 % (ref 60–150)

## 2022-11-22 LAB — PROTEIN S ACTIVITY: Protein S Activity: 71 % (ref 63–140)

## 2022-11-22 MED ORDER — HEPARIN BOLUS VIA INFUSION
2000.0000 [IU] | Freq: Once | INTRAVENOUS | Status: AC
  Administered 2022-11-22: 2000 [IU] via INTRAVENOUS
  Filled 2022-11-22: qty 2000

## 2022-11-22 NOTE — Progress Notes (Signed)
PROGRESS NOTE    Alfred Gray  RUE:[id number] DOB: 1966-04-07 DOA: 11/20/2022 PCP: Pcp, No    Chief Complaint  Patient presents with   Back Pain   Nausea    Brief Narrative:  Patient 57 year old gentleman history of ongoing tobacco use, hypertension, hyperlipidemia presented to ED with 2 to 3-day history of left flank pain and nausea.  Patient underwent CT angiogram chest abdomen and pelvis, CT abdomen and pelvis which was concerning for left renal infarcts.  2D echo done with normal EF, no source of emboli noted.  Hypercoagulable panel ordered.  Patient placed on IV heparin.  Vascular surgery and nephrology consulted.   Assessment & Plan:   Principal Problem:   Renal infarct Active Problems:   Tobacco abuse   HTN (hypertension)   Hyperlipidemia   Transaminitis   AKI (acute kidney injury)   Leukocytosis  #1 left renal infarct -Patient presented with left CVA tenderness, nausea. -Urinalysis negative for any acute infection. 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  Wide patency of proximal left renal artery.  Aortic atherosclerosis without dissection or aneurysm noted. -??  Etiology.  Patient with no history of A-fib. -CT angiogram chest done negative for pulmonary emboli. -2D echo done with normal EF, NWMA, grade 1 diastolic dysfunction, no emboli noted. -Continue telemetry to monitor for A-fib.   -Hypercoagulable panel ordered and pending with a homocystine level of 13.7, cardiolipin antibody IgG IgM negative, IgM at 14.  Antithrombin activity of 101.  Beta-2 glycoprotein negative. -Prothrombin gene mutation pending.  Factor V Leiden pending.  No lupus anticoagulant was detected. Protein S within normal limits, protein C activity within normal limits, total protein C pending. -Patient seen in consultation by vascular surgery who feel no  surgical intervention at this time and recommending possibility of a TEE for further evaluation and continuation of anticoagulation. -Patient also seen in consultation by nephrology. -Continue IV heparin. -Statin discontinued due to transaminitis this morning. -Patient currently afebrile however with a worsening leukocytosis. -Check blood cultures x 2 due to leukocytosis, will need a TEE. -Consulted cardiology for TEE which cannot be done until Tuesday, 11/25/2022.  2.  Acute kidney injury -Creatinine slowly trending up currently seems to be plateauing and at 1.5 today from 1.58 from 1.43 on admission.  Last creatinine noted on epic was 0.86 on 05/15/2012. -Urine output of 2.470 L over the past 24 hours. -Likely secondary to renal infarct in the setting of possible dehydration. -Urinalysis nitrite negative leukocytes negative small hemoglobin, 100 protein, 0-5 WBCs. -Patient noted to have received IV contrast on admission which may be contributing to rising creatinine. -Continue IV fluids. -Avoid nephrotoxins.   3.  Hypertension -Patient states has not taken his blood pressure medications in about 2 months.  States has not seen his PCP in over a year however does endorse that he has his antihypertensive medications at home however cannot remember the name. -BP improved on current regimen of Norvasc 10 mg daily.   -IV hydralazine as needed.    4.  History of hyperlipidemia -Fasting lipid panel with LDL of 101, total cholesterol of 199.   -Patient was started on Lipitor however due to transaminitis, statin currently on hold.    5.  Transaminitis -Patient noted with a transaminitis which was normal on admission. -LFTs trending down. -CT abdomen and pelvis concerning for cyst versus hemangiomas in the  liver. -Acute hepatitis panel is negative. -Continue IV fluids. -Repeat labs in the AM.   6.  Tobacco abuse -Tobacco cessation stressed to patient.   -Patient declined nicotine patch at  this time.  7.  Leukocytosis -??  Etiology. -Urinalysis done nitrite negative leukocytes -0-5 WBCs.  Patient with no urinary symptoms. -Chest x-ray unremarkable except for COPD. -Patient afebrile. -Due to presentation of renal infarcts, no emboli noted on 2D echo, CT angiogram chest negative for PE, with a rising leukocytosis concern for possible endocarditis. -2D echo unremarkable. -Check blood cultures x 2. -Patient likely needs TEE to rule out septic emboli versus emboli. -TEE cannot be done until Tuesday, 11/25/2022. -Hold off on antibiotics at this time unless blood cultures come back positive or worsening leukocytosis then will place empirically on IV antibiotics.     DVT prophylaxis: Heparin Code Status: Full Family Communication: Updated patient.  No family at bedside. Disposition: Home once workup is completed and clinical improvement.  Status is: Inpatient The patient will require care spanning > 2 midnights and should be moved to inpatient because: Severity of illness   Consultants:  Vascular surgery: Dr. Lenell Antu 11/20/2022 Nephrology: Dr. Glenna Fellows 11/21/2022   Procedures:  CT angiogram chest abdomen and pelvis 11/20/2022 CT abdomen and pelvis 11/20/2022 Chest x-ray 11/21/2022 2D echo 11/20/2022   Antimicrobials:  None   Subjective: Patient sitting up in chair.  Denies any chest pain.  No shortness of breath.  States significant improvement with left flank pain.  States has good urine output.  Denies any bleeding.   Objective: Vitals:   11/21/22 2321 11/22/22 0312 11/22/22 0843 11/22/22 1030  BP: (!) 131/90 132/77 132/83 118/70  Pulse: 74 72 66   Resp: 18 18 19    Temp: 98 F (36.7 C) 98.3 F (36.8 C) 98.1 F (36.7 C)   TempSrc: Oral Oral Oral   SpO2: 100% 100% 98%   Weight:  70.8 kg    Height:        Intake/Output Summary (Last 24 hours) at 11/22/2022 1111 Last data filed at 11/22/2022 1044 Gross per 24 hour  Intake 3999.03 ml  Output 2720 ml  Net  1279.03 ml   Filed Weights   11/20/22 0828 11/21/22 0540 11/22/22 0312  Weight: 84.8 kg 67.8 kg 70.8 kg    Examination:  General exam: NAD. Respiratory system: CTAB.  No wheezes, no crackles, no rhonchi.  Fair air movement.  Speaking in full sentences.   Cardiovascular system: RRR no murmurs rubs or gallops.  No JVD.  No lower extremity edema.  Gastrointestinal system: Abdomen is soft, nontender, nondistended, positive bowel sounds.  Significantly decreased left CVA TTP.  No rebound.  No guarding.  Central nervous system: Alert and oriented. No focal neurological deficits. Extremities: Symmetric 5 x 5 power. Skin: No rashes, lesions or ulcers Psychiatry: Judgement and insight appear normal. Mood & affect appropriate.     Data Reviewed: I have personally reviewed following labs and imaging studies  CBC: Recent Labs  Lab 11/20/22 0836 11/21/22 0205 11/22/22 0106  WBC 9.6 13.1* 15.5*  NEUTROABS  --  9.6* 12.1*  HGB 17.1* 17.8* 13.8  HCT 52.1* 51.6 40.9  MCV 93.5 91.0 91.5  PLT 275 230 157    Basic Metabolic Panel: Recent Labs  Lab 11/20/22 0836 11/21/22 0205 11/22/22 0106  NA 135 135 133*  K 4.4 4.4 3.7  CL 100 101 104  CO2 25 21* 21*  GLUCOSE 125* 113* 115*  BUN 6 12 17   CREATININE  1.43* 1.58* 1.59*  CALCIUM 9.4 9.4 8.4*  MG  --  2.0 1.9    GFR: Estimated Creatinine Clearance: 50.1 mL/min (A) (by C-G formula based on SCr of 1.59 mg/dL (H)).  Liver Function Tests: Recent Labs  Lab 11/20/22 1314 11/21/22 0205 11/22/22 0106  AST 40 159* 78*  ALT 26 94* 81*  ALKPHOS 59 57 46  BILITOT 1.4* 1.7* 1.0  PROT 7.4 7.1 5.6*  ALBUMIN 4.0 3.7 2.7*    CBG: No results for input(s): "GLUCAP" in the last 168 hours.   No results found for this or any previous visit (from the past 240 hour(s)).       Radiology Studies: DG Chest 2 View  Result Date: 11/21/2022 CLINICAL DATA:  Leukocytosis. EXAM: CHEST - 2 VIEW COMPARISON:  None Available. FINDINGS: The  heart size is normal. Lungs are clear. Changes of COPD are noted. No focal airspace disease is present. No edema or effusion is present. Degenerative changes are present at the shoulders bilaterally, left greater than right. The visualized soft tissues and bony thorax are otherwise unremarkable. IMPRESSION: 1. No acute cardiopulmonary disease. 2. COPD. Electronically Signed   By: Marin Roberts M.D.   On: 11/21/2022 09:53   ECHOCARDIOGRAM COMPLETE  Result Date: 11/20/2022    ECHOCARDIOGRAM REPORT   Patient Name:   Alfred Gray Date of Exam: 11/20/2022 Medical Rec #:  [id number]        Height:       71.0 in Accession #:    [id number]       Weight:       186.9 lb Date of Birth:  1966/02/24         BSA:          2.049 m Patient Age:    57 years         BP:           140/98 mmHg Patient Gender: M                HR:           61 bpm. Exam Location:  Inpatient Procedure: 2D Echo, Cardiac Doppler and Color Doppler Indications:    Chest pain  History:        Patient has no prior history of Echocardiogram examinations.                 Signs/Symptoms:Chest Pain; Risk Factors:Current Smoker,                 Dyslipidemia and Hypertension.  Sonographer:    Dondra Prader RVT RCS Referring Phys: [id number] [facility] A SMOOT IMPRESSIONS  1. Left ventricular ejection fraction, by estimation, is 55 to 60%. The left ventricle has normal function. The left ventricle has no regional wall motion abnormalities. There is mild concentric left ventricular hypertrophy. Left ventricular diastolic parameters are consistent with Grade I diastolic dysfunction (impaired relaxation).  2. Right ventricular systolic function is normal. The right ventricular size is normal. Tricuspid regurgitation signal is inadequate for assessing PA pressure.  3. The mitral valve is normal in structure. No evidence of mitral valve regurgitation. No evidence of mitral stenosis.  4. The aortic valve is tricuspid. There is mild calcification of the aortic valve.  Aortic valve regurgitation is not visualized. Aortic valve sclerosis/calcification is present, without any evidence of aortic stenosis. Aortic valve area, by VTI measures  2.05 cm. Aortic valve mean gradient measures 5.0 mmHg. Aortic valve Vmax measures 1.34 m/s.  5. The  inferior vena cava is normal in size with greater than 50% respiratory variability, suggesting right atrial pressure of 3 mmHg. FINDINGS  Left Ventricle: Left ventricular ejection fraction, by estimation, is 55 to 60%. The left ventricle has normal function. The left ventricle has no regional wall motion abnormalities. The left ventricular internal cavity size was normal in size. There is  mild concentric left ventricular hypertrophy. Left ventricular diastolic parameters are consistent with Grade I diastolic dysfunction (impaired relaxation). Normal left ventricular filling pressure. Right Ventricle: The right ventricular size is normal. No increase in right ventricular wall thickness. Right ventricular systolic function is normal. Tricuspid regurgitation signal is inadequate for assessing PA pressure. Left Atrium: Left atrial size was normal in size. Right Atrium: Right atrial size was normal in size. Pericardium: There is no evidence of pericardial effusion. Mitral Valve: The mitral valve is normal in structure. No evidence of mitral valve regurgitation. No evidence of mitral valve stenosis. Tricuspid Valve: The tricuspid valve is normal in structure. Tricuspid valve regurgitation is not demonstrated. No evidence of tricuspid stenosis. Aortic Valve: The aortic valve is tricuspid. There is mild calcification of the aortic valve. Aortic valve regurgitation is not visualized. Aortic valve sclerosis/calcification is present, without any evidence of aortic stenosis. Aortic valve mean gradient measures 5.0 mmHg. Aortic valve peak gradient measures 7.2 mmHg. Aortic valve area, by VTI measures 2.05 cm. Pulmonic Valve: The pulmonic valve was normal in  structure. Pulmonic valve regurgitation is trivial. No evidence of pulmonic stenosis. Aorta: The aortic root is normal in size and structure. Venous: The inferior vena cava is normal in size with greater than 50% respiratory variability, suggesting right atrial pressure of 3 mmHg. IAS/Shunts: No atrial level shunt detected by color flow Doppler.  LEFT VENTRICLE PLAX 2D LVIDd:         5.20 cm      Diastology LVIDs:         4.00 cm      LV e' medial:    3.64 cm/s LV PW:         1.30 cm      LV E/e' medial:  9.9 LV IVS:        1.30 cm      LV e' lateral:   5.43 cm/s LVOT diam:     2.00 cm      LV E/e' lateral: 6.6 LV SV:         61 LV SV Index:   30 LVOT Area:     3.14 cm  LV Volumes (MOD) LV vol d, MOD A2C: 193.0 ml LV vol d, MOD A4C: 199.0 ml LV vol s, MOD A2C: 72.2 ml LV vol s, MOD A4C: 75.8 ml LV SV MOD A2C:     120.8 ml LV SV MOD A4C:     199.0 ml LV SV MOD BP:      122.7 ml RIGHT VENTRICLE             IVC RV Basal diam:  3.60 cm     IVC diam: 1.60 cm RV S prime:     12.90 cm/s TAPSE (M-mode): 2.4 cm LEFT ATRIUM             Index        RIGHT ATRIUM           Index LA diam:        3.80 cm 1.85 cm/m   RA Area:     15.50 cm LA Vol (A2C):   69.2  ml 33.77 ml/m  RA Volume:   40.50 ml  19.77 ml/m LA Vol (A4C):   47.5 ml 23.18 ml/m LA Biplane Vol: 60.7 ml 29.62 ml/m  AORTIC VALVE                     PULMONIC VALVE AV Area (Vmax):    2.07 cm      PV Vmax:          0.87 m/s AV Area (Vmean):   1.88 cm      PV Peak grad:     3.1 mmHg AV Area (VTI):     2.05 cm      PR End Diast Vel: 1.43 msec AV Vmax:           134.00 cm/s AV Vmean:          105.000 cm/s AV VTI:            0.298 m AV Peak Grad:      7.2 mmHg AV Mean Grad:      5.0 mmHg LVOT Vmax:         88.30 cm/s LVOT Vmean:        62.800 cm/s LVOT VTI:          0.194 m LVOT/AV VTI ratio: 0.65  AORTA Ao Root diam: 3.40 cm Ao Asc diam:  3.30 cm Ao Arch diam: 2.7 cm MITRAL VALVE MV Area (PHT): 2.94 cm    SHUNTS MV Decel Time: 258 msec    Systemic VTI:  0.19 m MV E  velocity: 36.00 cm/s  Systemic Diam: 2.00 cm MV A velocity: 74.40 cm/s MV E/A ratio:  0.48 Armanda Magic MD Electronically signed by Armanda Magic MD Signature Date/Time: 11/20/2022/4:40:03 PM    Final    CT Angio Chest/Abd/Pel for Dissection W and/or Wo Contrast  Result Date: 11/20/2022 CLINICAL DATA:  Arterial embolism suspected, non-extremity, determine source. EXAM: CT ANGIOGRAPHY CHEST, ABDOMEN AND PELVIS TECHNIQUE: Non-contrast CT of the chest was initially obtained. Multidetector CT imaging through the chest, abdomen and pelvis was performed using the standard protocol during bolus administration of intravenous contrast. Multiplanar reconstructed images and MIPs were obtained and reviewed to evaluate the vascular anatomy. RADIATION DOSE REDUCTION: This exam was performed according to the departmental dose-optimization program which includes automated exposure control, adjustment of the mA and/or kV according to patient size and/or use of iterative reconstruction technique. CONTRAST:  OMNIPAQUE IOHEXOL 350 MG/ML SOLN COMPARISON:  CT abdomen and pelvis 11/20/2022 FINDINGS: CTA CHEST FINDINGS Cardiovascular: There is no evidence of a thoracic aortic intramural hematoma on noncontrast images. There is mild thoracic aortic atherosclerosis without evidence of a dissection or aneurysm. There is a common origin of the brachiocephalic and left common carotid arteries, a normal variant. No pulmonary arterial emboli are identified on this nondedicated study. The heart is borderline enlarged. There is no pericardial effusion. Mediastinum/Nodes: No enlarged axillary, mediastinal, or hilar lymph nodes. Unremarkable thyroid and esophagus. Lungs/Pleura: No pleural effusion or pneumothorax. No consolidation or suspicious nodule. Suspected small focus of mucous near the right mainstem bronchus orifice. Musculoskeletal: No acute osseous abnormality or suspicious osseous lesion. Review of the MIP images confirms the above  findings. CTA ABDOMEN AND PELVIS FINDINGS VASCULAR Aorta: Mild soft and calcified plaque, predominantly in the infrarenal aorta. No dissection, aneurysm, or significant stenosis. Celiac: Patent without evidence of aneurysm, dissection, vasculitis or significant stenosis. SMA: Patent without evidence of aneurysm, dissection, vasculitis or significant stenosis. Renals: Both renal arteries are patent  proximally without evidence of aneurysm, dissection, or fibromuscular dysplasia. There is occlusion of multiple small, distal left renal artery branch vessels. IMA: Patent without evidence of aneurysm, dissection, vasculitis or significant stenosis. Inflow: Moderate atherosclerosis without evidence of a significant stenosis, dissection, or aneurysm. Veins: No obvious venous abnormality within the limitations of this arterial phase study. Review of the MIP images confirms the above findings. NON-VASCULAR Hepatobiliary: Multiple small hypodense liver lesions with the largest measuring 1.5 cm and likely reflective of a cyst and with the majority of the other lesions being subcentimeter in size and too small to fully characterize. Unremarkable gallbladder. No biliary dilatation. Pancreas: Unremarkable. Spleen: Unremarkable. Adrenals/Urinary Tract: Unremarkable adrenal glands. Small focus of cortical scarring laterally in the mid inferior right kidney. Multiple large wedge-shaped regions of absent enhancement in the left kidney, similar to today's earlier CT. No hydronephrosis. Excreted contrast from today's earlier CT in the right renal collecting system, right ureter, and bladder. Stomach/Bowel: The stomach is grossly unremarkable. There is extensive left-sided colonic diverticulosis without evidence of acute diverticulitis. There is no evidence of bowel obstruction. The appendix is unremarkable. Lymphatic: No enlarged lymph nodes in the abdomen or pelvis. Reproductive: Unremarkable prostate. Other: No ascites or  pneumoperitoneum. Musculoskeletal: No acute osseous abnormality or suspicious osseous lesion. Review of the MIP images confirms the above findings. IMPRESSION: 1. 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 Wide patency of the proximal left renal artery. 2. Aortic atherosclerosis without dissection or aneurysm. 3. Colonic diverticulosis. Aortic Atherosclerosis (ICD10-I70.0). Electronically Signed   By: Sebastian Ache M.D.   On: 11/20/2022 15:58   CT ABDOMEN PELVIS W CONTRAST  Result Date: 11/20/2022 CLINICAL DATA:  Abdominal pain EXAM: CT ABDOMEN AND PELVIS WITH CONTRAST TECHNIQUE: Multidetector CT imaging of the abdomen and pelvis was performed using the standard protocol following bolus administration of intravenous contrast. RADIATION DOSE REDUCTION: This exam was performed according to the departmental dose-optimization program which includes automated exposure control, adjustment of the mA and/or kV according to patient size and/or use of iterative reconstruction technique. CONTRAST:  75mL OMNIPAQUE IOHEXOL 350 MG/ML SOLN COMPARISON:  None Available. FINDINGS: Lower chest: No focal infiltrates are seen in lower lung fields. Hepatobiliary: There are a few low-density foci in liver lesion measuring less than 1.5 cm. There is no dilation of bile ducts. Gallbladder is not distended. Pancreas: No focal abnormalities are seen. Spleen: Unremarkable. Adrenals/Urinary Tract: Adrenals are unremarkable. There is no hydronephrosis. There are multiple large areas of decreased enhancement in left kidney. There is no significant perinephric stranding. As far as seen, main left renal artery is patent. Left atrium and left ventricle are not included in their entirety for evaluation. There are no renal or ureteral stones. There is focal cortical thinning in the lateral aspect of lower pole of right kidney. Ureters are not dilated. Urinary bladder is unremarkable. Stomach/Bowel:  Stomach is not distended. There is mild diffuse mucosal enhancement in stomach. Small bowel loops are not dilated. Appendix is not dilated. There is no significant wall thickening in colon. Scattered diverticula are seen in colon without signs of focal acute diverticulitis. Vascular/Lymphatic: There are scattered atherosclerotic plaques and calcifications in aorta and its major branches. Reproductive: There are coarse calcifications in prostate. Other: There is no ascites or pneumoperitoneum. Umbilical hernia containing fat is seen. Musculoskeletal: No acute findings are seen. Degenerative changes are noted in lumbar spine, more so at the L4-L5 and L5-S1 levels with encroachment of neural foramina. IMPRESSION: There are  multiple patchy foci of decreased or absent perfusion in the left kidney. This may suggest acute pyelonephritis. Another possibility would be multiple emboli in peripheral left renal artery branches from central cardiac source. Please correlate with clinical and laboratory findings. If there is clinical suspicion for arterial emboli arising from the heart, echocardiogram may be considered. There is no evidence of intestinal obstruction or pneumoperitoneum. Appendix is not dilated. There is no hydronephrosis. There is mild diffuse mucosal enhancement in the stomach which may be an artifact caused by incomplete distention although suggest gastritis. There are multiple small low-density foci in liver, possibly cysts or hemangiomas. Diverticulosis of colon without signs of focal acute diverticulitis. Electronically Signed   By: Ernie Avena M.D.   On: 11/20/2022 13:40        Scheduled Meds:  amLODipine  10 mg Oral Daily   pantoprazole  40 mg Oral Daily   sodium chloride flush  3 mL Intravenous Q12H   Continuous Infusions:  sodium chloride 125 mL/hr at 11/22/22 0651   heparin 1,600 Units/hr (11/22/22 0651)     LOS: 1 day    Time spent: 40 minutes    Ramiro Harvest, MD [facility]   To contact the attending provider between 7A-7P or the covering provider during after hours 7P-7A, please log into the web site [email or web address] and access using universal [place] password for that web site. If you do not have the password, please call the hospital operator.  11/22/2022, 11:11 AM

## 2022-11-22 NOTE — Progress Notes (Signed)
ANTICOAGULATION CONSULT NOTE   Pharmacy Consult for Heparin Indication:  renal infarcts  No Known Allergies  Patient Measurements: Height: 5\' 11"  (180.3 cm) Weight: 70.8 kg (156 lb) IBW/kg (Calculated) : 75.3 Heparin Dosing Weight: 85 kg  Vital Signs: Temp: 98.4 F (36.9 C) (04/13 1652) Temp Source: Oral (04/13 1652) BP: 137/76 (04/13 1652) Pulse Rate: 73 (04/13 1652)  Labs: Recent Labs    11/20/22 0836 11/21/22 0205 11/21/22 1004 11/22/22 0106 11/22/22 1117 11/22/22 1725  HGB 17.1* 17.8*  --  13.8  --   --   HCT 52.1* 51.6  --  40.9  --   --   PLT 275 230  --  157  --   --   HEPARINUNFRC  --  0.53   < > <0.10* 0.14* 0.29*  CREATININE 1.43* 1.58*  --  1.59*  --   --    < > = values in this interval not displayed.     Estimated Creatinine Clearance: 50.1 mL/min (A) (by C-G formula based on SCr of 1.59 mg/dL (H)).   Medical History: Past Medical History:  Diagnosis Date   HTN (hypertension) 11/20/2022   Pancreatitis     Medications:  Medications Prior to Admission  Medication Sig Dispense Refill Last Dose   ondansetron (ZOFRAN) 4 MG tablet Take 1 tablet (4 mg total) by mouth every 6 (six) hours as needed for nausea. 20 tablet 0 11/20/2022   Docusate Sodium (DSS) 100 MG CAPS Take 100 mg by mouth 2 (two) times daily. (Patient not taking: Reported on 11/20/2022) 14 each 0 Not Taking   oxyCODONE-acetaminophen (PERCOCET/ROXICET) 5-325 MG per tablet Take 1 tablet by mouth every 6 (six) hours as needed. (Patient not taking: Reported on 11/20/2022) 30 tablet 0 Not Taking    Assessment: 57 y.o. M presents with flank pain and nausea. 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 Hypercoagulable labs pending. Pharmacy dosing heparin.  Plans noted for oral anticoagulation when work-up is complete. Work is underway to get Xarelto approved by his insurance (Eliquis is non-formulary).  Heparin level today is subtherapeutic at 0.29, on 1600 units/hr. The day shift nurse mentioned  that the patient's heparin gtt was running this morning ~0730 but was somehow disconnected between then and 0900 (unknown how long the line was disconnected during this time). Hgb 13.8, plt 157 (stable).   Goal of Therapy:  Heparin level 0.3-0.7 units/ml Monitor platelets by anticoagulation protocol: Yes   Plan:  -Increase heparin infusion to 1700 units/hr to get into goal range -Check heparin level with AM labs  -Monitor heparin level, CBC, and s/sx of bleeding daily   Thank you for allowing pharmacy to participate in this patient's care,  Sherron Monday, PharmD, BCCCP Clinical Pharmacist  Phone: [phone] 11/22/2022 6:43 PM  Please check AMION for all [facility] Pharmacy phone numbers After 10:00 PM, call Main Pharmacy [phone]

## 2022-11-22 NOTE — Progress Notes (Signed)
[facility] Progress Note   Subjective:   feeling better today - flank pain improving  Objective Vitals:   11/21/22 2321 11/22/22 0312 11/22/22 0843 11/22/22 1030  BP: (!) 131/90 132/77 132/83 118/70  Pulse: 74 72 66   Resp: 18 18 19    Temp: 98 F (36.7 C) 98.3 F (36.8 C) 98.1 F (36.7 C)   TempSrc: Oral Oral Oral   SpO2: 100% 100% 98%   Weight:  70.8 kg    Height:       Physical Exam General: well appearing in chair Heart: RRR Lungs: clear Abdomen: soft Extremities: no edema  Additional Objective Labs: Basic Metabolic Panel: Recent Labs  Lab 11/20/22 0836 11/21/22 0205 11/22/22 0106  NA 135 135 133*  K 4.4 4.4 3.7  CL 100 101 104  CO2 25 21* 21*  GLUCOSE 125* 113* 115*  BUN 6 12 17   CREATININE 1.43* 1.58* 1.59*  CALCIUM 9.4 9.4 8.4*   Liver Function Tests: Recent Labs  Lab 11/20/22 1314 11/21/22 0205 11/22/22 0106  AST 40 159* 78*  ALT 26 94* 81*  ALKPHOS 59 57 46  BILITOT 1.4* 1.7* 1.0  PROT 7.4 7.1 5.6*  ALBUMIN 4.0 3.7 2.7*   Recent Labs  Lab 11/20/22 1314  LIPASE 31   CBC: Recent Labs  Lab 11/20/22 0836 11/21/22 0205 11/22/22 0106  WBC 9.6 13.1* 15.5*  NEUTROABS  --  9.6* 12.1*  HGB 17.1* 17.8* 13.8  HCT 52.1* 51.6 40.9  MCV 93.5 91.0 91.5  PLT 275 230 157   Blood Culture No results found for: "SDES", "SPECREQUEST", "CULT", "REPTSTATUS"  Cardiac Enzymes: No results for input(s): "CKTOTAL", "CKMB", "CKMBINDEX", "TROPONINI" in the last 168 hours. CBG: No results for input(s): "GLUCAP" in the last 168 hours. Iron Studies: No results for input(s): "IRON", "TIBC", "TRANSFERRIN", "FERRITIN" in the last 72 hours. @lablastinr3 @ Studies/Results: DG Chest 2 View  Result Date: 11/21/2022 CLINICAL DATA:  Leukocytosis. EXAM: CHEST - 2 VIEW COMPARISON:  None Available. FINDINGS: The heart size is normal. Lungs are clear. Changes of COPD are noted. No focal airspace disease is present. No edema or effusion is present.  Degenerative changes are present at the shoulders bilaterally, left greater than right. The visualized soft tissues and bony thorax are otherwise unremarkable. IMPRESSION: 1. No acute cardiopulmonary disease. 2. COPD. Electronically Signed   By: Marin Roberts M.D.   On: 11/21/2022 09:53   ECHOCARDIOGRAM COMPLETE  Result Date: 11/20/2022    ECHOCARDIOGRAM REPORT   Patient Name:   Alfred Gray Date of Exam: 11/20/2022 Medical Rec #:  [id number]        Height:       71.0 in Accession #:    [id number]       Weight:       186.9 lb Date of Birth:  29-Sep-1965         BSA:          2.049 m Patient Age:    57 years         BP:           140/98 mmHg Patient Gender: M                HR:           61 bpm. Exam Location:  Inpatient Procedure: 2D Echo, Cardiac Doppler and Color Doppler Indications:    Chest pain  History:        Patient has no prior history of Echocardiogram examinations.  Signs/Symptoms:Chest Pain; Risk Factors:Current Smoker,                 Dyslipidemia and Hypertension.  Sonographer:    Dondra Prader RVT RCS Referring Phys: [id number] [facility] A SMOOT IMPRESSIONS  1. Left ventricular ejection fraction, by estimation, is 55 to 60%. The left ventricle has normal function. The left ventricle has no regional wall motion abnormalities. There is mild concentric left ventricular hypertrophy. Left ventricular diastolic parameters are consistent with Grade I diastolic dysfunction (impaired relaxation).  2. Right ventricular systolic function is normal. The right ventricular size is normal. Tricuspid regurgitation signal is inadequate for assessing PA pressure.  3. The mitral valve is normal in structure. No evidence of mitral valve regurgitation. No evidence of mitral stenosis.  4. The aortic valve is tricuspid. There is mild calcification of the aortic valve. Aortic valve regurgitation is not visualized. Aortic valve sclerosis/calcification is present, without any evidence of aortic stenosis.  Aortic valve area, by VTI measures  2.05 cm. Aortic valve mean gradient measures 5.0 mmHg. Aortic valve Vmax measures 1.34 m/s.  5. The inferior vena cava is normal in size with greater than 50% respiratory variability, suggesting right atrial pressure of 3 mmHg. FINDINGS  Left Ventricle: Left ventricular ejection fraction, by estimation, is 55 to 60%. The left ventricle has normal function. The left ventricle has no regional wall motion abnormalities. The left ventricular internal cavity size was normal in size. There is  mild concentric left ventricular hypertrophy. Left ventricular diastolic parameters are consistent with Grade I diastolic dysfunction (impaired relaxation). Normal left ventricular filling pressure. Right Ventricle: The right ventricular size is normal. No increase in right ventricular wall thickness. Right ventricular systolic function is normal. Tricuspid regurgitation signal is inadequate for assessing PA pressure. Left Atrium: Left atrial size was normal in size. Right Atrium: Right atrial size was normal in size. Pericardium: There is no evidence of pericardial effusion. Mitral Valve: The mitral valve is normal in structure. No evidence of mitral valve regurgitation. No evidence of mitral valve stenosis. Tricuspid Valve: The tricuspid valve is normal in structure. Tricuspid valve regurgitation is not demonstrated. No evidence of tricuspid stenosis. Aortic Valve: The aortic valve is tricuspid. There is mild calcification of the aortic valve. Aortic valve regurgitation is not visualized. Aortic valve sclerosis/calcification is present, without any evidence of aortic stenosis. Aortic valve mean gradient measures 5.0 mmHg. Aortic valve peak gradient measures 7.2 mmHg. Aortic valve area, by VTI measures 2.05 cm. Pulmonic Valve: The pulmonic valve was normal in structure. Pulmonic valve regurgitation is trivial. No evidence of pulmonic stenosis. Aorta: The aortic root is normal in size and  structure. Venous: The inferior vena cava is normal in size with greater than 50% respiratory variability, suggesting right atrial pressure of 3 mmHg. IAS/Shunts: No atrial level shunt detected by color flow Doppler.  LEFT VENTRICLE PLAX 2D LVIDd:         5.20 cm      Diastology LVIDs:         4.00 cm      LV e' medial:    3.64 cm/s LV PW:         1.30 cm      LV E/e' medial:  9.9 LV IVS:        1.30 cm      LV e' lateral:   5.43 cm/s LVOT diam:     2.00 cm      LV E/e' lateral: 6.6 LV SV:  61 LV SV Index:   30 LVOT Area:     3.14 cm  LV Volumes (MOD) LV vol d, MOD A2C: 193.0 ml LV vol d, MOD A4C: 199.0 ml LV vol s, MOD A2C: 72.2 ml LV vol s, MOD A4C: 75.8 ml LV SV MOD A2C:     120.8 ml LV SV MOD A4C:     199.0 ml LV SV MOD BP:      122.7 ml RIGHT VENTRICLE             IVC RV Basal diam:  3.60 cm     IVC diam: 1.60 cm RV S prime:     12.90 cm/s TAPSE (M-mode): 2.4 cm LEFT ATRIUM             Index        RIGHT ATRIUM           Index LA diam:        3.80 cm 1.85 cm/m   RA Area:     15.50 cm LA Vol (A2C):   69.2 ml 33.77 ml/m  RA Volume:   40.50 ml  19.77 ml/m LA Vol (A4C):   47.5 ml 23.18 ml/m LA Biplane Vol: 60.7 ml 29.62 ml/m  AORTIC VALVE                     PULMONIC VALVE AV Area (Vmax):    2.07 cm      PV Vmax:          0.87 m/s AV Area (Vmean):   1.88 cm      PV Peak grad:     3.1 mmHg AV Area (VTI):     2.05 cm      PR End Diast Vel: 1.43 msec AV Vmax:           134.00 cm/s AV Vmean:          105.000 cm/s AV VTI:            0.298 m AV Peak Grad:      7.2 mmHg AV Mean Grad:      5.0 mmHg LVOT Vmax:         88.30 cm/s LVOT Vmean:        62.800 cm/s LVOT VTI:          0.194 m LVOT/AV VTI ratio: 0.65  AORTA Ao Root diam: 3.40 cm Ao Asc diam:  3.30 cm Ao Arch diam: 2.7 cm MITRAL VALVE MV Area (PHT): 2.94 cm    SHUNTS MV Decel Time: 258 msec    Systemic VTI:  0.19 m MV E velocity: 36.00 cm/s  Systemic Diam: 2.00 cm MV A velocity: 74.40 cm/s MV E/A ratio:  0.48 Armanda Magic MD Electronically signed  by Armanda Magic MD Signature Date/Time: 11/20/2022/4:40:03 PM    Final    CT Angio Chest/Abd/Pel for Dissection W and/or Wo Contrast  Result Date: 11/20/2022 CLINICAL DATA:  Arterial embolism suspected, non-extremity, determine source. EXAM: CT ANGIOGRAPHY CHEST, ABDOMEN AND PELVIS TECHNIQUE: Non-contrast CT of the chest was initially obtained. Multidetector CT imaging through the chest, abdomen and pelvis was performed using the standard protocol during bolus administration of intravenous contrast. Multiplanar reconstructed images and MIPs were obtained and reviewed to evaluate the vascular anatomy. RADIATION DOSE REDUCTION: This exam was performed according to the departmental dose-optimization program which includes automated exposure control, adjustment of the mA and/or kV according to patient size and/or use of iterative reconstruction technique. CONTRAST:   OMNIPAQUE IOHEXOL 350 MG/ML SOLN COMPARISON:  CT abdomen and pelvis 11/20/2022 FINDINGS: CTA CHEST FINDINGS Cardiovascular: There is no evidence of a thoracic aortic intramural hematoma on noncontrast images. There is mild thoracic aortic atherosclerosis without evidence of a dissection or aneurysm. There is a common origin of the brachiocephalic and left common carotid arteries, a normal variant. No pulmonary arterial emboli are identified on this nondedicated study. The heart is borderline enlarged. There is no pericardial effusion. Mediastinum/Nodes: No enlarged axillary, mediastinal, or hilar lymph nodes. Unremarkable thyroid and esophagus. Lungs/Pleura: No pleural effusion or pneumothorax. No consolidation or suspicious nodule. Suspected small focus of mucous near the right mainstem bronchus orifice. Musculoskeletal: No acute osseous abnormality or suspicious osseous lesion. Review of the MIP images confirms the above findings. CTA ABDOMEN AND PELVIS FINDINGS VASCULAR Aorta: Mild soft and calcified plaque, predominantly in the infrarenal aorta.  No dissection, aneurysm, or significant stenosis. Celiac: Patent without evidence of aneurysm, dissection, vasculitis or significant stenosis. SMA: Patent without evidence of aneurysm, dissection, vasculitis or significant stenosis. Renals: Both renal arteries are patent proximally without evidence of aneurysm, dissection, or fibromuscular dysplasia. There is occlusion of multiple small, distal left renal artery branch vessels. IMA: Patent without evidence of aneurysm, dissection, vasculitis or significant stenosis. Inflow: Moderate atherosclerosis without evidence of a significant stenosis, dissection, or aneurysm. Veins: No obvious venous abnormality within the limitations of this arterial phase study. Review of the MIP images confirms the above findings. NON-VASCULAR Hepatobiliary: Multiple small hypodense liver lesions with the largest measuring 1.5 cm and likely reflective of a cyst and with the majority of the other lesions being subcentimeter in size and too small to fully characterize. Unremarkable gallbladder. No biliary dilatation. Pancreas: Unremarkable. Spleen: Unremarkable. Adrenals/Urinary Tract: Unremarkable adrenal glands. Small focus of cortical scarring laterally in the mid inferior right kidney. Multiple large wedge-shaped regions of absent enhancement in the left kidney, similar to today's earlier CT. No hydronephrosis. Excreted contrast from today's earlier CT in the right renal collecting system, right ureter, and bladder. Stomach/Bowel: The stomach is grossly unremarkable. There is extensive left-sided colonic diverticulosis without evidence of acute diverticulitis. There is no evidence of bowel obstruction. The appendix is unremarkable. Lymphatic: No enlarged lymph nodes in the abdomen or pelvis. Reproductive: Unremarkable prostate. Other: No ascites or pneumoperitoneum. Musculoskeletal: No acute osseous abnormality or suspicious osseous lesion. Review of the MIP images confirms the above  findings. IMPRESSION: 1. 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 Wide patency of the proximal left renal artery. 2. Aortic atherosclerosis without dissection or aneurysm. 3. Colonic diverticulosis. Aortic Atherosclerosis (ICD10-I70.0). Electronically Signed   By: Sebastian Ache M.D.   On: 11/20/2022 15:58   Medications:  sodium chloride 125 mL/hr at 11/22/22 1351   heparin 1,600 Units/hr (11/22/22 0651)    amLODipine  10 mg Oral Daily   pantoprazole  40 mg Oral Daily   sodium chloride flush  3 mL Intravenous Q12H    Assessment/PlanCharlie T Gray is an 57 y.o. 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    **renal infarct:  distribution makes it look embolic.  TTE unrevealing, poss TEE to be pursued.  On heparin, will transition to PO if procedures not necessary. Presentation not consistent with vasculitis.  Hypercoag w/u underway.  Nothing to add to w/u.    **AKI:  Cr 1.4 on presentation, no recent values for comparison.  May have some CKD related to untreated HTN.  Up to 1.58 after IV contrast administration; stable at 1.59 today.  UA + blood +1 protein -- suspect secondary to infarct, do not think GN.   Looks euvolemic.  Avoid nephrotoxins.  Will follow labs, I/Os.   Has underlying CKD risk factor of HTN.     **HTN: was not taking meds x 2y.  Started amlodipine here - AM BP 130s which is acceptable for now.  Avoid hypotension with AKI.     **Tobacco use: cessation recommended   **HTN:  statin  Nothing further to add, will f/u with patient in clinic in 2-3 months.    Estill Bakes MD 11/22/2022, 2:01 PM   [facility] Pager: [phone]

## 2022-11-22 NOTE — Progress Notes (Signed)
ANTICOAGULATION CONSULT NOTE   Pharmacy Consult for Heparin Indication:  renal infarcts  No Known Allergies  Patient Measurements: Height: 5\' 11"  (180.3 cm) Weight: 70.8 kg (156 lb) IBW/kg (Calculated) : 75.3 Heparin Dosing Weight: 85 kg  Vital Signs: Temp: 98.1 F (36.7 C) (04/13 0843) Temp Source: Oral (04/13 0843) BP: 118/70 (04/13 1030) Pulse Rate: 66 (04/13 0843)  Labs: Recent Labs    11/20/22 0836 11/20/22 0836 11/21/22 0205 11/21/22 1004 11/22/22 0106 11/22/22 1117  HGB 17.1*  --  17.8*  --  13.8  --   HCT 52.1*  --  51.6  --  40.9  --   PLT 275  --  230  --  157  --   HEPARINUNFRC  --    < > 0.53 0.34 <0.10* 0.14*  CREATININE 1.43*  --  1.58*  --  1.59*  --    < > = values in this interval not displayed.     Estimated Creatinine Clearance: 50.1 mL/min (A) (by C-G formula based on SCr of 1.59 mg/dL (H)).   Medical History: Past Medical History:  Diagnosis Date   HTN (hypertension) 11/20/2022   Pancreatitis     Medications:  Medications Prior to Admission  Medication Sig Dispense Refill Last Dose   ondansetron (ZOFRAN) 4 MG tablet Take 1 tablet (4 mg total) by mouth every 6 (six) hours as needed for nausea. 20 tablet 0 11/20/2022   Docusate Sodium (DSS) 100 MG CAPS Take 100 mg by mouth 2 (two) times daily. (Patient not taking: Reported on 11/20/2022) 14 each 0 Not Taking   oxyCODONE-acetaminophen (PERCOCET/ROXICET) 5-325 MG per tablet Take 1 tablet by mouth every 6 (six) hours as needed. (Patient not taking: Reported on 11/20/2022) 30 tablet 0 Not Taking    Assessment: 57 y.o. M presents with flank pain and nausea. 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 Hypercoagulable labs pending. Pharmacy dosing heparin.  Plans noted for oral anticoagulation when work-up is complete. Work is underway to get Xarelto approved by his insurance (Eliquis is non-formulary).  Heparin level today is subtherapeutic at 0.14, on 1600 units/hr. The day shift nurse mentioned  that the patient's heparin gtt was running this morning ~0730 but was somehow disconnected between then and 0900 (unknown how long the line was disconnected during this time). Hgb 13.8, plt 157--stable. Given the patient was previously therapeutic at 1300 units/hr, will hold off on re-bolusing, continue current rate, and re-check heparin level this evening.    Goal of Therapy:  Heparin level 0.3-0.7 units/ml Monitor platelets by anticoagulation protocol: Yes   Plan:  -Continue heparin at 1600 units/hr -Check heparin level in 6hrs  -Monitor heparin level, CBC, and s/sx of bleeding daily    Cherylin Mylar, PharmD PGY1 Pharmacy Resident 4/13/202412:36 PM

## 2022-11-22 NOTE — Progress Notes (Signed)
ANTICOAGULATION CONSULT NOTE - Follow Up Consult  Pharmacy Consult for heparin Indication:  renal infarct  Labs: Recent Labs    11/20/22 0836 11/21/22 0205 11/21/22 1004 11/22/22 0106  HGB 17.1* 17.8*  --  13.8  HCT 52.1* 51.6  --  40.9  PLT 275 230  --  157  HEPARINUNFRC  --  0.53 0.34 <0.10*  CREATININE 1.43* 1.58*  --  1.59*    Assessment: 57yo male subtherapeutic on heparin after two levels at goal though had been trending down with most recent level at low end of goal; no infusion issues or signs of bleeding per RN.  Goal of Therapy:  Heparin level 0.3-0.7 units/ml   Plan:  Will rebolus with heparin 2000 units, increase heparin infusion by 4 units/kg/hr to 1600 units/hr, and check level in 8 hours.    Vernard Gambles, PharmD, BCPS  11/22/2022,3:13 AM

## 2022-11-23 LAB — COMPREHENSIVE METABOLIC PANEL
ALT: 66 U/L — ABNORMAL HIGH (ref 0–44)
AST: 42 U/L — ABNORMAL HIGH (ref 15–41)
Albumin: 2.9 g/dL — ABNORMAL LOW (ref 3.5–5.0)
Alkaline Phosphatase: 46 U/L (ref 38–126)
Anion gap: 7 (ref 5–15)
BUN: 15 mg/dL (ref 6–20)
CO2: 23 mmol/L (ref 22–32)
Calcium: 8.9 mg/dL (ref 8.9–10.3)
Chloride: 106 mmol/L (ref 98–111)
Creatinine, Ser: 1.48 mg/dL — ABNORMAL HIGH (ref 0.61–1.24)
GFR, Estimated: 54 mL/min — ABNORMAL LOW (ref >60)
Glucose, Bld: 105 mg/dL — ABNORMAL HIGH (ref 70–99)
Potassium: 3.8 mmol/L (ref 3.5–5.1)
Sodium: 136 mmol/L (ref 135–145)
Total Bilirubin: 0.9 mg/dL (ref 0.3–1.2)
Total Protein: 6.6 g/dL (ref 6.5–8.1)

## 2022-11-23 LAB — CBC WITH DIFFERENTIAL/PLATELET
Abs Immature Granulocytes: 0.03 10*3/uL (ref 0.00–0.07)
Basophils Absolute: 0.1 10*3/uL (ref 0.0–0.1)
Basophils Relative: 1 %
Eosinophils Absolute: 0.1 10*3/uL (ref 0.0–0.5)
Eosinophils Relative: 1 %
HCT: 42.4 % (ref 39.0–52.0)
Hemoglobin: 14.1 g/dL (ref 13.0–17.0)
Immature Granulocytes: 0 %
Lymphocytes Relative: 15 %
Lymphs Abs: 1.6 10*3/uL (ref 0.7–4.0)
MCH: 30.7 pg (ref 26.0–34.0)
MCHC: 33.3 g/dL (ref 30.0–36.0)
MCV: 92.2 fL (ref 80.0–100.0)
Monocytes Absolute: 1.6 10*3/uL — ABNORMAL HIGH (ref 0.1–1.0)
Monocytes Relative: 15 %
Neutro Abs: 7.2 10*3/uL (ref 1.7–7.7)
Neutrophils Relative %: 68 %
Platelets: 156 10*3/uL (ref 150–400)
RBC: 4.6 MIL/uL (ref 4.22–5.81)
RDW: 14.2 % (ref 11.5–15.5)
WBC: 10.5 10*3/uL (ref 4.0–10.5)
nRBC: 0 % (ref 0.0–0.2)

## 2022-11-23 LAB — MAGNESIUM: Magnesium: 2 mg/dL (ref 1.7–2.4)

## 2022-11-23 LAB — HEPARIN LEVEL (UNFRACTIONATED): Heparin Unfractionated: 0.49 IU/mL (ref 0.30–0.70)

## 2022-11-23 LAB — CULTURE, BLOOD (ROUTINE X 2): Special Requests: ADEQUATE

## 2022-11-23 NOTE — Progress Notes (Addendum)
PROGRESS NOTE    Alfred Gray  MWN:[id number] DOB: Feb 03, 1966 DOA: 11/20/2022 PCP: Pcp, No    Chief Complaint  Patient presents with   Back Pain   Nausea    Brief Narrative:  Patient 57 year old gentleman history of ongoing tobacco use, hypertension, hyperlipidemia presented to ED with 2 to 3-day history of left flank pain and nausea.  Patient underwent CT angiogram chest abdomen and pelvis, CT abdomen and pelvis which was concerning for left renal infarcts.  2D echo done with normal EF, no source of emboli noted.  Hypercoagulable panel ordered.  Patient placed on IV heparin.  Vascular surgery and nephrology consulted.   Assessment & Plan:   Principal Problem:   Renal infarct Active Problems:   Tobacco abuse   HTN (hypertension)   Hyperlipidemia   Transaminitis   AKI (acute kidney injury)   Leukocytosis  #1 left renal infarct -Patient presented with left CVA tenderness, nausea. -Urinalysis negative for any acute infection. 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  Wide patency of proximal left renal artery.  Aortic atherosclerosis without dissection or aneurysm noted. -??  Etiology.  Patient with no history of A-fib. -CT angiogram chest done negative for pulmonary emboli. -2D echo done with normal EF, NWMA, grade 1 diastolic dysfunction, no emboli noted. -Continue telemetry to monitor for A-fib.   -Hypercoagulable panel ordered and pending with a homocystine level of 13.7, cardiolipin antibody IgG IgM negative, IgM at 14.  Antithrombin activity of 101.  Beta-2 glycoprotein negative. -Prothrombin gene mutation pending.  Factor V Leiden pending.  No lupus anticoagulant was detected. Protein S within normal limits, protein C activity within normal limits, total protein C pending. -Patient seen in consultation by vascular surgery who feel no  surgical intervention at this time and recommending possibility of a TEE for further evaluation and continuation of anticoagulation. -Patient also seen in consultation by nephrology. -Continue IV heparin. -Statin discontinued due to transaminitis. -Patient currently afebrile however noted early on to have a worsening leukocytosis which is trending back down.   -Blood cultures ordered and pending.   -Will likely need a TEE.   -Consulted cardiology for TEE which cannot be done until Tuesday, 11/25/2022. -Will need outpatient follow-up with nephrology.  2.  Acute kidney injury -Creatinine was slowly trending up seem to have plateaued and slowly trending back down currently at 1.48 from 1.59 from 1.58 from 1.43 on admission.  - Last creatinine noted on epic was 0.86 on 05/15/2012. -Urine output of 3.5 L over the past 24 hours. -Acute kidney injury likely secondary to renal infarct in the setting of possible dehydration. -Urinalysis nitrite negative leukocytes negative small hemoglobin, 100 protein, 0-5 WBCs. -Patient noted to have received IV contrast on admission which may be contributing to rising creatinine. -Continue IV fluids. -Avoid nephrotoxins. -Nephrology was following.   3.  Hypertension -Patient states has not taken his blood pressure medications in about 2 months.  States has not seen his PCP in over a year however does endorse that he has his antihypertensive medications at home however cannot remember the name. -BP currently controlled on current regimen of Norvasc 10 mg daily.   -IV hydralazine as needed.    4.  History of hyperlipidemia -Fasting lipid panel with LDL of 101, total cholesterol of 199.   -Patient was started on Lipitor however due to transaminitis, statin currently on  hold.    5.  Transaminitis -Patient noted with a transaminitis which was normal on admission. -LFTs trending down. -CT abdomen and pelvis concerning for cyst versus hemangiomas in the  liver. -Acute hepatitis panel is negative. -Continue IV fluids. -Repeat labs in the AM. -Outpatient follow-up with PCP.   6.  Tobacco abuse -Tobacco cessation stressed to patient.   -Patient declined nicotine patch at this time.  7.  Leukocytosis -??  Etiology. -Urinalysis done nitrite negative leukocytes -0-5 WBCs.  Patient with no urinary symptoms. -Chest x-ray unremarkable except for COPD. -Patient afebrile. -Due to presentation of renal infarcts, no emboli noted on 2D echo, CT angiogram chest negative for PE, with a rising leukocytosis concern for possible endocarditis. -2D echo unremarkable. -Blood cultures pending.   -Leukocytosis trending back down.   -Patient likely needs TEE to rule out septic emboli versus emboli. -TEE cannot be done until Tuesday, 11/25/2022. -Hold off on antibiotics at this time unless blood cultures come back positive or worsening leukocytosis then will place empirically on IV antibiotics.     DVT prophylaxis: Heparin Code Status: Full Family Communication: Updated patient.  No family at bedside. Disposition: Home once workup is completed and clinical improvement.  Status is: Inpatient The patient will require care spanning > 2 midnights and should be moved to inpatient because: Severity of illness   Consultants:  Vascular surgery: Dr. Lenell Antu 11/20/2022 Nephrology: Dr. Glenna Fellows 11/21/2022   Procedures:  CT angiogram chest abdomen and pelvis 11/20/2022 CT abdomen and pelvis 11/20/2022 Chest x-ray 11/21/2022 2D echo 11/20/2022   Antimicrobials:  None   Subjective: Laying in bed.  No chest pain.  No shortness of breath.  No abdominal pain.  Left flank pain has improved significantly per patient.  No bleeding.   Objective: Vitals:   11/22/22 2316 11/23/22 0415 11/23/22 0616 11/23/22 0833  BP: (!) 141/84 (!) 149/89  133/84  Pulse: 69 72  71  Resp: Temp: 98.3 F (36.8 C) 98.7 F (37.1 C)  97.9 F (36.6 C)  TempSrc: Oral Oral   Oral  SpO2: 99% 100%  100%  Weight:   68.8 kg   Height:        Intake/Output Summary (Last 24 hours) at 11/23/2022 1223 Last data filed at 11/23/2022 0830 Gross per 24 hour  Intake 3716.52 ml  Output 3020 ml  Net 696.52 ml    Filed Weights   11/21/22 0540 11/22/22 0312 11/23/22 0616  Weight: 67.8 kg 70.8 kg 68.8 kg    Examination:  General exam: NAD. Respiratory system: Lungs clear to auscultation bilaterally.  No wheezes, no crackles, no rhonchi.  Fair air movement.  Speaking in full sentences.  Cardiovascular system: Regular rate rhythm no murmurs rubs or gallops.  No JVD.  No lower extremity edema. Gastrointestinal system: Abdomen is soft, nontender, nondistended, positive bowel sounds.  No significant left CVA TTP.  No rebound.  No guarding.  Central nervous system: Alert and oriented. No focal neurological deficits. Extremities: Symmetric 5 x 5 power. Skin: No rashes, lesions or ulcers Psychiatry: Judgement and insight appear normal. Mood & affect appropriate.     Data Reviewed: I have personally reviewed following labs and imaging studies  CBC: Recent Labs  Lab 11/20/22 0836 11/21/22 0205 11/22/22 0106 11/23/22 0155  WBC 9.6 13.1* 15.5* 10.5  NEUTROABS  --  9.6* 12.1* 7.2  HGB 17.1* 17.8* 13.8 14.1  HCT 52.1* 51.6 40.9 42.4  MCV 93.5 91.0 91.5 92.2  PLT 275 230  157 156     Basic Metabolic Panel: Recent Labs  Lab 11/20/22 0836 11/21/22 0205 11/22/22 0106 11/23/22 0155  NA 135 135 133* 136  K 4.4 4.4 3.7 3.8  CL 100 101 104 106  CO2 25 21* 21* 23  GLUCOSE 125* 113* 115* 105*  BUN 6 12 17 15   CREATININE 1.43* 1.58* 1.59* 1.48*  CALCIUM 9.4 9.4 8.4* 8.9  MG  --  2.0 1.9 2.0     GFR: Estimated Creatinine Clearance: 52.3 mL/min (A) (by C-G formula based on SCr of 1.48 mg/dL (H)).  Liver Function Tests: Recent Labs  Lab 11/20/22 1314 11/21/22 0205 11/22/22 0106 11/23/22 0155  AST 40 159* 78* 42*  ALT 26 94* 81* 66*  ALKPHOS 59 57 46 46   BILITOT 1.4* 1.7* 1.0 0.9  PROT 7.4 7.1 5.6* 6.6  ALBUMIN 4.0 3.7 2.7* 2.9*     CBG: No results for input(s): "GLUCAP" in the last 168 hours.   Recent Results (from the past 240 hour(s))  Culture, blood (Routine X 2) w Reflex to ID Panel     Status: None (Preliminary result)   Collection Time: 11/22/22 11:17 AM   Specimen: BLOOD RIGHT HAND  Result Value Ref Range Status   Specimen Description BLOOD RIGHT HAND  Final   Special Requests   Final    BOTTLES DRAWN AEROBIC ONLY Blood Culture adequate volume   Culture   Final    NO GROWTH < 24 HOURS Performed at [facility], [address] [place], [place] [address]    Report Status PENDING  Incomplete  Culture, blood (Routine X 2) w Reflex to ID Panel     Status: None (Preliminary result)   Collection Time: 11/22/22 11:18 AM   Specimen: BLOOD LEFT HAND  Result Value Ref Range Status   Specimen Description BLOOD LEFT HAND  Final   Special Requests   Final    BOTTLES DRAWN AEROBIC AND ANAEROBIC Blood Culture adequate volume   Culture   Final    NO GROWTH < 24 HOURS Performed at [facility], [address], [place], [place] [address]    Report Status PENDING  Incomplete         Radiology Studies: No results found.      Scheduled Meds:  amLODipine  10 mg Oral Daily   pantoprazole  40 mg Oral Daily   sodium chloride flush  3 mL Intravenous Q12H   Continuous Infusions:  sodium chloride 125 mL/hr at 11/23/22 0635   heparin 1,700 Units/hr (11/23/22 0931)     LOS: 2 days    Time spent: 40 minutes    Ramiro Harvest, MD [facility]   To contact the attending provider between 7A-7P or the covering provider during after hours 7P-7A, please log into the web site [email or web address] and access using universal [place] password for that web site. If you do not have the password, please call the hospital operator.  11/23/2022, 12:23 PM

## 2022-11-23 NOTE — Progress Notes (Signed)
ANTICOAGULATION CONSULT NOTE   Pharmacy Consult for Heparin Indication:  renal infarcts  No Known Allergies  Patient Measurements: Height: 5\' 11"  (180.3 cm) Weight: 68.8 kg (151 lb 11.2 oz) IBW/kg (Calculated) : 75.3 Heparin Dosing Weight: 85 kg  Vital Signs: Temp: 97.9 F (36.6 C) (04/14 0833) Temp Source: Oral (04/14 0833) BP: 133/84 (04/14 0833) Pulse Rate: 71 (04/14 0833)  Labs: Recent Labs    11/21/22 0205 11/21/22 1004 11/22/22 0106 11/22/22 1117 11/22/22 1725 11/23/22 0155  HGB 17.8*  --  13.8  --   --  14.1  HCT 51.6  --  40.9  --   --  42.4  PLT 230  --  157  --   --  156  HEPARINUNFRC 0.53   < > <0.10* 0.14* 0.29* 0.49  CREATININE 1.58*  --  1.59*  --   --  1.48*   < > = values in this interval not displayed.     Estimated Creatinine Clearance: 52.3 mL/min (A) (by C-G formula based on SCr of 1.48 mg/dL (H)).   Medical History: Past Medical History:  Diagnosis Date   HTN (hypertension) 11/20/2022   Pancreatitis     Medications:  Medications Prior to Admission  Medication Sig Dispense Refill Last Dose   ondansetron (ZOFRAN) 4 MG tablet Take 1 tablet (4 mg total) by mouth every 6 (six) hours as needed for nausea. 20 tablet 0 11/20/2022   Docusate Sodium (DSS) 100 MG CAPS Take 100 mg by mouth 2 (two) times daily. (Patient not taking: Reported on 11/20/2022) 14 each 0 Not Taking   oxyCODONE-acetaminophen (PERCOCET/ROXICET) 5-325 MG per tablet Take 1 tablet by mouth every 6 (six) hours as needed. (Patient not taking: Reported on 11/20/2022) 30 tablet 0 Not Taking    Assessment: 57 y.o. M presents with flank pain and nausea. 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 Hypercoagulable labs pending. Pharmacy dosing heparin.  Plans noted for oral anticoagulation when work-up is complete. Work is underway to get Xarelto approved by his insurance (Eliquis is non-formulary).  Heparin level today is therapeutic at 0.49, on 1700 units/hr. Hgb 14.1, plt 156. No line  issues or signs/symptoms of bleeding reported.  Goal of Therapy:  Heparin level 0.3-0.7 units/ml Monitor platelets by anticoagulation protocol: Yes   Plan:  -Continue heparin gtt @1700  units/hr -Monitor heparin level, CBC, and s/sx of bleeding daily  -TEE planned for Tuesday, 4/16   Thank you for allowing pharmacy to participate in this patient's care,  Cherylin Mylar, PharmD PGY1 Pharmacy Resident 4/14/20249:14 AM

## 2022-11-24 ENCOUNTER — Inpatient Hospital Stay (HOSPITAL_COMMUNITY): Payer: Commercial Managed Care - PPO

## 2022-11-24 ENCOUNTER — Encounter (HOSPITAL_COMMUNITY): Payer: Self-pay | Admitting: Internal Medicine

## 2022-11-24 ENCOUNTER — Other Ambulatory Visit (HOSPITAL_COMMUNITY): Payer: Self-pay

## 2022-11-24 ENCOUNTER — Telehealth (HOSPITAL_COMMUNITY): Payer: Self-pay | Admitting: Pharmacy Technician

## 2022-11-24 ENCOUNTER — Telehealth (HOSPITAL_COMMUNITY): Payer: Self-pay

## 2022-11-24 ENCOUNTER — Inpatient Hospital Stay (HOSPITAL_COMMUNITY): Payer: Commercial Managed Care - PPO | Admitting: Certified Registered Nurse Anesthetist

## 2022-11-24 ENCOUNTER — Encounter (HOSPITAL_COMMUNITY)
Admission: EM | Disposition: A | Discharge status: Home / Self Care | Payer: Self-pay | Source: Home / Self Care | Attending: Internal Medicine

## 2022-11-24 DIAGNOSIS — F17210 Nicotine dependence, cigarettes, uncomplicated: Secondary | ICD-10-CM

## 2022-11-24 DIAGNOSIS — N289 Disorder of kidney and ureter, unspecified: Secondary | ICD-10-CM

## 2022-11-24 DIAGNOSIS — N280 Ischemia and infarction of kidney: Secondary | ICD-10-CM

## 2022-11-24 DIAGNOSIS — I10 Essential (primary) hypertension: Secondary | ICD-10-CM

## 2022-11-24 HISTORY — PX: TEE WITHOUT CARDIOVERSION: SHX5443

## 2022-11-24 LAB — COMPREHENSIVE METABOLIC PANEL
ALT: 51 U/L — ABNORMAL HIGH (ref 0–44)
AST: 30 U/L (ref 15–41)
Albumin: 2.8 g/dL — ABNORMAL LOW (ref 3.5–5.0)
Alkaline Phosphatase: 46 U/L (ref 38–126)
Anion gap: 6 (ref 5–15)
BUN: 13 mg/dL (ref 6–20)
CO2: 21 mmol/L — ABNORMAL LOW (ref 22–32)
Calcium: 8.5 mg/dL — ABNORMAL LOW (ref 8.9–10.3)
Chloride: 108 mmol/L (ref 98–111)
Creatinine, Ser: 1.36 mg/dL — ABNORMAL HIGH (ref 0.61–1.24)
GFR, Estimated: 60 mL/min — ABNORMAL LOW (ref >60)
Glucose, Bld: 112 mg/dL — ABNORMAL HIGH (ref 70–99)
Potassium: 3.6 mmol/L (ref 3.5–5.1)
Sodium: 135 mmol/L (ref 135–145)
Total Bilirubin: 0.9 mg/dL (ref 0.3–1.2)
Total Protein: 6.1 g/dL — ABNORMAL LOW (ref 6.5–8.1)

## 2022-11-24 LAB — CBC WITH DIFFERENTIAL/PLATELET
Abs Immature Granulocytes: 0.02 10*3/uL (ref 0.00–0.07)
Basophils Absolute: 0.1 10*3/uL (ref 0.0–0.1)
Basophils Relative: 1 %
Eosinophils Absolute: 0.2 10*3/uL (ref 0.0–0.5)
Eosinophils Relative: 3 %
HCT: 37.9 % — ABNORMAL LOW (ref 39.0–52.0)
Hemoglobin: 13.5 g/dL (ref 13.0–17.0)
Immature Granulocytes: 0 %
Lymphocytes Relative: 22 %
Lymphs Abs: 1.6 10*3/uL (ref 0.7–4.0)
MCH: 31.7 pg (ref 26.0–34.0)
MCHC: 35.6 g/dL (ref 30.0–36.0)
MCV: 89 fL (ref 80.0–100.0)
Monocytes Absolute: 1 10*3/uL (ref 0.1–1.0)
Monocytes Relative: 13 %
Neutro Abs: 4.5 10*3/uL (ref 1.7–7.7)
Neutrophils Relative %: 61 %
Platelets: 169 10*3/uL (ref 150–400)
RBC: 4.26 MIL/uL (ref 4.22–5.81)
RDW: 14 % (ref 11.5–15.5)
WBC: 7.4 10*3/uL (ref 4.0–10.5)
nRBC: 0 % (ref 0.0–0.2)

## 2022-11-24 LAB — CULTURE, BLOOD (ROUTINE X 2): Culture: NO GROWTH

## 2022-11-24 LAB — HEPARIN LEVEL (UNFRACTIONATED): Heparin Unfractionated: 0.49 IU/mL (ref 0.30–0.70)

## 2022-11-24 SURGERY — ECHOCARDIOGRAM, TRANSESOPHAGEAL
Anesthesia: Monitor Anesthesia Care

## 2022-11-24 MED ORDER — PROPOFOL 500 MG/50ML IV EMUL
INTRAVENOUS | Status: DC | PRN
  Administered 2022-11-24: 100 ug/kg/min via INTRAVENOUS

## 2022-11-24 MED ORDER — SODIUM CHLORIDE 0.9 % IV SOLN: INTRAVENOUS | Status: DC

## 2022-11-24 MED ORDER — RIVAROXABAN 15 MG PO TABS
15.0000 mg | ORAL_TABLET | Freq: Two times a day (BID) | ORAL | Status: DC
  Administered 2022-11-24 – 2022-11-25 (×3): 15 mg via ORAL
  Filled 2022-11-24 (×3): qty 1

## 2022-11-24 MED ORDER — RIVAROXABAN 20 MG PO TABS: 20.0000 mg | ORAL_TABLET | Freq: Every day | ORAL | Status: DC

## 2022-11-24 NOTE — CV Procedure (Signed)
TEE  Shared Decision Making/Informed Consent The risks [esophageal damage, perforation (1:10,000 risk), bleeding, pharyngeal hematoma as well as other potential complications associated with conscious sedation including aspiration, arrhythmia, respiratory failure and death], benefits (treatment guidance and diagnostic support) and alternatives of a transesophageal echocardiogram were discussed in detail with Alfred Gray and he is willing to proceed.   Patient sedated with propofol by anesthesia    Mouth guard placed in mouth .    TEE probe advanced to mid esophagus without difficulty   Normal valve function   NO obvious vegetations NO masses seen LAA without masses No PFO by color doppler or as tested with injection of agitated saline  LVEF and RVEF normal  Full report to follow in CV section of chart Procedure was without complication  Dietrich Pates MD

## 2022-11-24 NOTE — Progress Notes (Signed)
   [facility] has been requested to perform a transesophageal echocardiogram on Alfred Gray for renal infarct.  After careful review of history and examination, the risks and benefits of transesophageal echocardiogram have been explained including risks of esophageal damage, perforation (1:10,000 risk), bleeding, pharyngeal hematoma as well as other potential complications associated with conscious sedation including aspiration, arrhythmia, respiratory failure and death. Alternatives to treatment were discussed, questions were answered. Patient is willing to proceed.   NPO, scheduled today with Dr. Tenny Craw.  Roe Rutherford Ethell [place], [place]  11/24/2022 9:25 AM

## 2022-11-24 NOTE — TOC Benefit Eligibility Note (Signed)
Patient Product/process development scientist completed.    The patient is currently admitted and upon discharge could be taking Xarelto  The current 30 day co-pay is Benefit Exclusion.   The patient is insured through [facility]   This test claim was processed through [facility]- copay amounts may vary at other pharmacies due to pharmacy/plan contracts, or as the patient moves through the different stages of their insurance plan.

## 2022-11-24 NOTE — Plan of Care (Signed)

## 2022-11-24 NOTE — Progress Notes (Signed)
ANTICOAGULATION CONSULT NOTE - Follow Up Consult  Pharmacy Consult for Heparin Indication:  renal infarcts  No Known Allergies  Patient Measurements: Height: 5\' 11"  (180.3 cm) Weight: 69.4 kg (153 lb 1.6 oz) IBW/kg (Calculated) : 75.3 Heparin Dosing Weight: 69.4 kg  Vital Signs: Temp: 98.3 F (36.8 C) (04/15 0900) Temp Source: Oral (04/15 0900) BP: 140/94 (04/15 0900) Pulse Rate: 57 (04/15 0900)  Labs: Recent Labs    11/22/22 0106 11/22/22 1117 11/22/22 1725 11/23/22 0155 11/24/22 0202  HGB 13.8  --   --  14.1 13.5  HCT 40.9  --   --  42.4 37.9*  PLT 157  --   --  156 169  HEPARINUNFRC <0.10*   < > 0.29* 0.49 0.49  CREATININE 1.59*  --   --  1.48* 1.36*   < > = values in this interval not displayed.    Estimated Creatinine Clearance: 57.4 mL/min (A) (by C-G formula based on SCr of 1.36 mg/dL (H)).  Assessment: 57 y.o. M presents with flank pain and nausea. 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 Hypercoagulable labs pending. Pharmacy dosing heparin.   Plans noted for oral anticoagulation when work-up is complete. Work is underway to get Xarelto approved by his insurance (Eliquis is non-formulary).  Heparin level remains therapeutic (0.49) on 1700 units/hr. CBC stable. No bleeding reported.  TEE is now planned for today.  Goal of Therapy:  Heparin level 0.3-0.7 units/ml Monitor platelets by anticoagulation protocol: Yes   Plan:  Continue heparin drip at 1700 units/hr Daily heparin level and CBC. TEE today, follow up results. Plan DOAC when able. F/u Xarelto coverage/insurance approval.  Dennie Fetters, RPh 11/24/2022,10:17 AM

## 2022-11-24 NOTE — Anesthesia Postprocedure Evaluation (Signed)
Anesthesia Post Note  Patient: AMILLION HELDERMAN  Procedure(s) Performed: TRANSESOPHAGEAL ECHOCARDIOGRAM     Patient location during evaluation: PACU Anesthesia Type: MAC Level of consciousness: awake and alert Pain management: pain level controlled Vital Signs Assessment: post-procedure vital signs reviewed and stable Respiratory status: spontaneous breathing, nonlabored ventilation, respiratory function stable and patient connected to nasal cannula oxygen Cardiovascular status: stable and blood pressure returned to baseline Postop Assessment: no apparent nausea or vomiting Anesthetic complications: no  No notable events documented.  Last Vitals:  Vitals:   11/24/22 1450 11/24/22 1452  BP: 135/84   Pulse: (!) 59   Resp: 19   Temp:  36.8 C  SpO2: 97%     Last Pain:  Vitals:   11/24/22 1452  TempSrc: Temporal  PainSc: 0-No pain                 Kennieth Rad

## 2022-11-24 NOTE — Progress Notes (Signed)
Post TEE, Heparin infusing at 1700 units/hr w/0.9NS infusing at 125cc/hr to rt forearm

## 2022-11-24 NOTE — Telephone Encounter (Signed)
Patient Advocate Encounter   Received notification that prior authorization for Xarelto 20 mg is required.   PA submitted on 11/20/2022 Talked to insurance again on 11/24/2022 the insurance did not do the Prior Authorization correctly.  They have done it correctly now and has expedited the Prior Authorization. Insurance [employer] [phone] Status is pending       Roland Earl, CPhT Pharmacy Patient Advocate Specialist [facility] Patient Advocate Team Direct Number: [phone]  Fax: [phone]

## 2022-11-24 NOTE — Progress Notes (Signed)
Echocardiogram Echocardiogram Transesophageal has been performed.  Alfred Gray 11/24/2022, 2:40 PM

## 2022-11-24 NOTE — Progress Notes (Signed)
ANTICOAGULATION CONSULT NOTE - Follow Up Consult  Pharmacy Consult for Heparin >> Xarelto Indication:  renal infarcts  No Known Allergies  Patient Measurements: Height: 5\' 11"  (180.3 cm) Weight: 69.4 kg (153 lb 1.6 oz) IBW/kg (Calculated) : 75.3 Heparin Dosing Weight: 69.4 kg  Vital Signs: Temp: 98.1 F (36.7 C) (04/15 1521) Temp Source: Oral (04/15 1521) BP: 144/81 (04/15 1521) Pulse Rate: 59 (04/15 1521)  Labs: Recent Labs    11/22/22 0106 11/22/22 1117 11/22/22 1725 11/23/22 0155 11/24/22 0202  HGB 13.8  --   --  14.1 13.5  HCT 40.9  --   --  42.4 37.9*  PLT 157  --   --  156 169  HEPARINUNFRC <0.10*   < > 0.29* 0.49 0.49  CREATININE 1.59*  --   --  1.48* 1.36*   < > = values in this interval not displayed.     Estimated Creatinine Clearance: 57.4 mL/min (A) (by C-G formula based on SCr of 1.36 mg/dL (H)).  Assessment: 57 y.o. M presents with flank pain and nausea. 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 Hypercoagulable labs pending. Pharmacy consulted to transition heparin drip to Xarelto. Heparin continued during/after TEE.   Prior auth pending for Xarelto, if patient discharges prior to PA being approved can use 30-day free coupon.  Goal of Therapy:  Heparin level 0.3-0.7 units/ml Monitor platelets by anticoagulation protocol: Yes   Plan:  Stop heparin drip Begin Xarelto 15mg  PO BID x 21 days, followed by Xarelto 20mg  daily thereafater Pharmacy to provide Xarelto education prior to discharge  Rexford Maus, PharmD, BCPS 11/24/2022 4:07 PM

## 2022-11-24 NOTE — Transfer of Care (Signed)
Immediate Anesthesia Transfer of Care Note  Patient: KAMAI NOWICKI  Procedure(s) Performed: TRANSESOPHAGEAL ECHOCARDIOGRAM  Patient Location: PACU and Cath Lab  Anesthesia Type:MAC  Level of Consciousness: awake, alert , patient cooperative, and responds to stimulation  Airway & Oxygen Therapy: Patient Spontanous Breathing  Post-op Assessment: Report given to RN and Post -op Vital signs reviewed and stable  Post vital signs: Reviewed and stable  Last Vitals:  Vitals Value Taken Time  BP    Temp    Pulse 64 11/24/22 1422  Resp 15 11/24/22 1422  SpO2 100 % 11/24/22 1422  Vitals shown include unvalidated device data.  Last Pain:  Vitals:   11/24/22 1243  TempSrc: Oral  PainSc:       Patients Stated Pain Goal: 0 (11/21/22 1817)  Complications: No notable events documented.

## 2022-11-24 NOTE — Anesthesia Preprocedure Evaluation (Signed)
Anesthesia Evaluation  Patient identified by MRN, date of birth, ID band Patient awake    Reviewed: Allergy & Precautions, NPO status , Patient's Chart, lab work & pertinent test results  Airway Mallampati: III  TM Distance: >3 FB Neck ROM: Limited    Dental  (+) Dental Advisory Given   Pulmonary Current Smoker   breath sounds clear to auscultation       Cardiovascular hypertension, Pt. on medications  Rhythm:Regular Rate:Normal     Neuro/Psych negative neurological ROS     GI/Hepatic negative GI ROS, Neg liver ROS,,,  Endo/Other  negative endocrine ROS    Renal/GU Renal InsufficiencyRenal disease     Musculoskeletal   Abdominal   Peds  Hematology negative hematology ROS (+)   Anesthesia Other Findings   Reproductive/Obstetrics                             Anesthesia Physical Anesthesia Plan  ASA: 2  Anesthesia Plan: MAC   Post-op Pain Management: Minimal or no pain anticipated   Induction:   PONV Risk Score and Plan: 0 and Propofol infusion  Airway Management Planned: Natural Airway and Nasal Cannula  Additional Equipment:   Intra-op Plan:   Post-operative Plan:   Informed Consent: I have reviewed the patients History and Physical, chart, labs and discussed the procedure including the risks, benefits and alternatives for the proposed anesthesia with the patient or authorized representative who has indicated his/her understanding and acceptance.       Plan Discussed with:   Anesthesia Plan Comments:        Anesthesia Quick Evaluation

## 2022-11-24 NOTE — Progress Notes (Signed)
PROGRESS NOTE    Alfred Gray  NWG:[id number] DOB: 1966-07-19 DOA: 11/20/2022 PCP: Pcp, No    Chief Complaint  Patient presents with   Back Pain   Nausea    Brief Narrative:  Patient 57 year old gentleman history of ongoing tobacco use, hypertension, hyperlipidemia presented to ED with 2 to 3-day history of left flank pain and nausea.  Patient underwent CT angiogram chest abdomen and pelvis, CT abdomen and pelvis which was concerning for left renal infarcts.  2D echo done with normal EF, no source of emboli noted.  Hypercoagulable panel ordered.  Patient placed on IV heparin.  Vascular surgery and nephrology consulted. TEE pending.   Assessment & Plan:   Principal Problem:   Renal infarct Active Problems:   Tobacco abuse   HTN (hypertension)   Hyperlipidemia   Transaminitis   AKI (acute kidney injury)   Leukocytosis  #1 left renal infarct -Patient presented with left CVA tenderness, nausea. -Urinalysis negative for any acute infection. 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  Wide patency of proximal left renal artery.  Aortic atherosclerosis without dissection or aneurysm noted. -??  Etiology.  Patient with no history of A-fib. -CT angiogram chest done negative for pulmonary emboli. -2D echo done with normal EF, NWMA, grade 1 diastolic dysfunction, no emboli noted. -Continue telemetry to monitor for A-fib.   -Hypercoagulable panel ordered and pending with a homocystine level of 13.7, cardiolipin antibody IgG IgM negative, IgM at 14.  Antithrombin activity of 101.  Beta-2 glycoprotein negative. -Prothrombin gene mutation pending.  Factor V Leiden pending.  No lupus anticoagulant was detected. Protein S within normal limits, protein C activity within normal limits, total protein C pending. -Patient seen in consultation by vascular surgery who  feel no surgical intervention at this time and recommending possibility of a TEE for further evaluation and continuation of anticoagulation. -Patient also seen in consultation by nephrology. -Continue IV heparin pending TEE. -Statin discontinued due to transaminitis. -Patient currently afebrile however noted early on to have a worsening leukocytosis which is trending back down.   -Blood cultures ordered and pending with no growth to date. -Patient afebrile. -Patient for TEE today. -Will need outpatient follow-up with nephrology.  2.  Acute kidney injury -Creatinine was slowly trending up seem to have plateaued and slowly trending back down currently at 1.36 from 1.48 from 1.59 from 1.58 from 1.43 on admission.  - Last creatinine noted on epic was 0.86 on 05/15/2012. -Urine output of 3.7 L over the past 24 hours. -Acute kidney injury likely secondary to renal infarct in the setting of possible dehydration. -Urinalysis nitrite negative leukocytes negative small hemoglobin, 100 protein, 0-5 WBCs. -Patient noted to have received IV contrast on admission which may be contributing to rising creatinine. -Continue IV fluids. -Avoid nephrotoxins. -Nephrology was following but have signed off as of 11/23/2022.   3.  Hypertension -Patient states has not taken his blood pressure medications in about 2 months.  States has not seen his PCP in over a year however does endorse that he has his antihypertensive medications at home however cannot remember the name. -BP currently controlled on current regimen of Norvasc 10 mg daily.   -IV hydralazine as needed.    4.  History of hyperlipidemia -Fasting lipid panel with LDL of 101, total cholesterol of 199.   -Patient was started on Lipitor however due to  transaminitis, statin currently on hold.    5.  Transaminitis -Patient noted with a transaminitis which was normal on admission. -LFTs trending down. -CT abdomen and pelvis concerning for cyst versus  hemangiomas in the liver. -Acute hepatitis panel is negative. -Transaminitis improving with hydration.   -Continue IV fluids.   -Repeat labs in the AM. -Outpatient follow-up with PCP.   6.  Tobacco abuse -Tobacco cessation stressed to patient.   -Patient declined nicotine patch at this time.  7.  Leukocytosis -??  Etiology.  Could likely be reactive secondary to renal infarcts. -Urinalysis done nitrite negative leukocytes -0-5 WBCs.  Patient with no urinary symptoms. -Chest x-ray unremarkable except for COPD. -Patient afebrile. -Due to presentation of renal infarcts, no emboli noted on 2D echo, CT angiogram chest negative for PE, with initially a rising leukocytosis with concern for possible endocarditis.   -Leukocytosis is trending back down.   -2D echo unremarkable. -Blood cultures pending.    -Patient likely needs TEE to rule out septic emboli versus emboli. -Blood cultures with no growth to date x 2 days. -TEE to be done today, Monday, 11/24/2022.  -Hold off on antibiotics at this time unless blood cultures come back positive or worsening leukocytosis then will place empirically on IV antibiotics.     DVT prophylaxis: Heparin Code Status: Full Family Communication: Updated patient.  No family at bedside. Disposition: Home once workup is completed and clinical improvement.  Status is: Inpatient The patient will require care spanning > 2 midnights and should be moved to inpatient because: Severity of illness   Consultants:  Vascular surgery: Dr. Lenell Antu 11/20/2022 Nephrology: Dr. Glenna Fellows 11/21/2022   Procedures:  CT angiogram chest abdomen and pelvis 11/20/2022 CT abdomen and pelvis 11/20/2022 Chest x-ray 11/21/2022 2D echo 11/20/2022 TEE pending 11/24/2022  Antimicrobials:  None   Subjective: Laying in bed.  Denies any chest pain or shortness of breath.  Denies any further flank pain.  Overall feels well.  No bleeding.  Very appreciative of the care he is receiving.     Objective: Vitals:   11/23/22 1917 11/23/22 2317 11/24/22 0324 11/24/22 0900  BP: 130/84 124/74 121/80 (!) 140/94  Pulse: 70 72 60 (!) 57  Resp: 17 19 16 18   Temp: 98.2 F (36.8 C) 98.3 F (36.8 C) 98.4 F (36.9 C) 98.3 F (36.8 C)  TempSrc: Oral Oral Oral Oral  SpO2: 100% 100% 100% 100%  Weight:   69.4 kg   Height:        Intake/Output Summary (Last 24 hours) at 11/24/2022 0949 Last data filed at 11/24/2022 0900 Gross per 24 hour  Intake 3343.14 ml  Output 3890 ml  Net -546.86 ml    Filed Weights   11/22/22 0312 11/23/22 0616 11/24/22 0324  Weight: 70.8 kg 68.8 kg 69.4 kg    Examination:  General exam: NAD. Respiratory system: CTAB.  No wheezes, no crackles, no rhonchi.  Fair air movement.  Speaking full sentences.   Cardiovascular system: RRR no murmurs rubs or gallops.  No JVD.  No lower extremity edema.  Gastrointestinal system: Abdomen is soft, nontender, nondistended, positive bowel sounds.  No rebound.  No guarding.  No significant left CVA tenderness to palpation.   Central nervous system: Alert and oriented. No focal neurological deficits. Extremities: Symmetric 5 x 5 power. Skin: No rashes, lesions or ulcers Psychiatry: Judgement and insight appear normal. Mood & affect appropriate.     Data Reviewed: I have personally reviewed following labs and imaging studies  CBC:  Recent Labs  Lab 11/20/22 0836 11/21/22 0205 11/22/22 0106 11/23/22 0155 11/24/22 0202  WBC 9.6 13.1* 15.5* 10.5 7.4  NEUTROABS  --  9.6* 12.1* 7.2 4.5  HGB 17.1* 17.8* 13.8 14.1 13.5  HCT 52.1* 51.6 40.9 42.4 37.9*  MCV 93.5 91.0 91.5 92.2 89.0  PLT 275 230 157 156 169     Basic Metabolic Panel: Recent Labs  Lab 11/20/22 0836 11/21/22 0205 11/22/22 0106 11/23/22 0155 11/24/22 0202  NA 135 135 133* 136 135  K 4.4 4.4 3.7 3.8 3.6  CL 100 101 104 106 108  CO2 25 21* 21* 23 21*  GLUCOSE 125* 113* 115* 105* 112*  BUN CREATININE 1.43* 1.58* 1.59* 1.48*  1.36*  CALCIUM 9.4 9.4 8.4* 8.9 8.5*  MG  --  2.0 1.9 2.0  --      GFR: Estimated Creatinine Clearance: 57.4 mL/min (A) (by C-G formula based on SCr of 1.36 mg/dL (H)).  Liver Function Tests: Recent Labs  Lab 11/20/22 1314 11/21/22 0205 11/22/22 0106 11/23/22 0155 11/24/22 0202  AST 40 159* 78* 42* 30  ALT 26 94* 81* 66* 51*  ALKPHOS 59 57 46 46 46  BILITOT 1.4* 1.7* 1.0 0.9 0.9  PROT 7.4 7.1 5.6* 6.6 6.1*  ALBUMIN 4.0 3.7 2.7* 2.9* 2.8*     CBG: No results for input(s): "GLUCAP" in the last 168 hours.   Recent Results (from the past 240 hour(s))  Culture, blood (Routine X 2) w Reflex to ID Panel     Status: None (Preliminary result)   Collection Time: 11/22/22 11:17 AM   Specimen: BLOOD RIGHT HAND  Result Value Ref Range Status   Specimen Description BLOOD RIGHT HAND  Final   Special Requests   Final    BOTTLES DRAWN AEROBIC ONLY Blood Culture adequate volume   Culture   Final    NO GROWTH 2 DAYS Performed at [facility], [address] [place], [place] [address]    Report Status PENDING  Incomplete  Culture, blood (Routine X 2) w Reflex to ID Panel     Status: None (Preliminary result)   Collection Time: 11/22/22 11:18 AM   Specimen: BLOOD LEFT HAND  Result Value Ref Range Status   Specimen Description BLOOD LEFT HAND  Final   Special Requests   Final    BOTTLES DRAWN AEROBIC AND ANAEROBIC Blood Culture adequate volume   Culture   Final    NO GROWTH 2 DAYS Performed at [facility], [address] [place], [place] [address]    Report Status PENDING  Incomplete         Radiology Studies: No results found.      Scheduled Meds:  amLODipine  10 mg Oral Daily   pantoprazole  40 mg Oral Daily   sodium chloride flush  3 mL Intravenous Q12H   Continuous Infusions:  sodium chloride 125 mL/hr at 11/24/22 0600   heparin 1,700 Units/hr (11/24/22 0600)     LOS: 3 days    Time spent: 35 minutes    Ramiro Harvest, MD [facility]   To contact the attending provider between 7A-7P or the covering provider during after hours 7P-7A, please log into the web site [email or web address] and access using universal [place] password for that web site. If you do not have the password, please call the hospital operator.  11/24/2022, 9:49 AM

## 2022-11-24 NOTE — Telephone Encounter (Signed)
Pharmacy Patient Advocate Encounter  Insurance verification completed.    The patient is insured through [facility])   The patient is currently admitted and ran test claims for the following: Xarelto.  Copays and coinsurance results were relayed to Inpatient clinical team.  This test claim was processed through [facility]- copay amounts may vary at other pharmacies due to pharmacy/plan contracts, or as the patient moves through the different stages of their insurance plan.

## 2022-11-25 ENCOUNTER — Other Ambulatory Visit (HOSPITAL_COMMUNITY): Payer: Self-pay

## 2022-11-25 ENCOUNTER — Encounter (HOSPITAL_COMMUNITY): Payer: Self-pay | Admitting: Internal Medicine

## 2022-11-25 LAB — COMPREHENSIVE METABOLIC PANEL
ALT: 44 U/L (ref 0–44)
AST: 24 U/L (ref 15–41)
Albumin: 2.9 g/dL — ABNORMAL LOW (ref 3.5–5.0)
Alkaline Phosphatase: 44 U/L (ref 38–126)
Anion gap: 6 (ref 5–15)
BUN: 12 mg/dL (ref 6–20)
CO2: 25 mmol/L (ref 22–32)
Calcium: 8.9 mg/dL (ref 8.9–10.3)
Chloride: 106 mmol/L (ref 98–111)
Creatinine, Ser: 1.42 mg/dL — ABNORMAL HIGH (ref 0.61–1.24)
GFR, Estimated: 57 mL/min — ABNORMAL LOW (ref >60)
Glucose, Bld: 95 mg/dL (ref 70–99)
Potassium: 4.2 mmol/L (ref 3.5–5.1)
Sodium: 137 mmol/L (ref 135–145)
Total Bilirubin: 1.1 mg/dL (ref 0.3–1.2)
Total Protein: 6.3 g/dL — ABNORMAL LOW (ref 6.5–8.1)

## 2022-11-25 LAB — CBC WITH DIFFERENTIAL/PLATELET
Abs Immature Granulocytes: 0.02 10*3/uL (ref 0.00–0.07)
Basophils Absolute: 0.1 10*3/uL (ref 0.0–0.1)
Basophils Relative: 1 %
Eosinophils Absolute: 0.2 10*3/uL (ref 0.0–0.5)
Eosinophils Relative: 3 %
HCT: 40.2 % (ref 39.0–52.0)
Hemoglobin: 13.6 g/dL (ref 13.0–17.0)
Immature Granulocytes: 0 %
Lymphocytes Relative: 25 %
Lymphs Abs: 1.5 10*3/uL (ref 0.7–4.0)
MCH: 30.8 pg (ref 26.0–34.0)
MCHC: 33.8 g/dL (ref 30.0–36.0)
MCV: 91 fL (ref 80.0–100.0)
Monocytes Absolute: 0.9 10*3/uL (ref 0.1–1.0)
Monocytes Relative: 14 %
Neutro Abs: 3.6 10*3/uL (ref 1.7–7.7)
Neutrophils Relative %: 57 %
Platelets: 181 10*3/uL (ref 150–400)
RBC: 4.42 MIL/uL (ref 4.22–5.81)
RDW: 13.8 % (ref 11.5–15.5)
WBC: 6.2 10*3/uL (ref 4.0–10.5)
nRBC: 0 % (ref 0.0–0.2)

## 2022-11-25 LAB — CULTURE, BLOOD (ROUTINE X 2): Special Requests: ADEQUATE

## 2022-11-25 MED ORDER — RIVAROXABAN (XARELTO) VTE STARTER PACK (15 & 20 MG)
ORAL_TABLET | ORAL | 0 refills | Status: AC
  Filled 2022-11-25: qty 51, 30d supply, fill #0

## 2022-11-25 MED ORDER — PANTOPRAZOLE SODIUM 40 MG PO TBEC
40.0000 mg | DELAYED_RELEASE_TABLET | Freq: Every day | ORAL | 2 refills | Status: AC
  Filled 2022-11-25: qty 30, 30d supply, fill #0

## 2022-11-25 MED ORDER — ATORVASTATIN CALCIUM 40 MG PO TABS
40.0000 mg | ORAL_TABLET | Freq: Every day | ORAL | 3 refills | Status: AC
  Filled 2022-11-25: qty 30, 30d supply, fill #0

## 2022-11-25 MED ORDER — ACETAMINOPHEN 500 MG PO TABS: 500.0000 mg | ORAL_TABLET | Freq: Four times a day (QID) | ORAL | 0 refills | Status: AC | PRN

## 2022-11-25 MED ORDER — AMLODIPINE BESYLATE 10 MG PO TABS
10.0000 mg | ORAL_TABLET | Freq: Every day | ORAL | 2 refills | Status: AC
  Filled 2022-11-25: qty 30, 30d supply, fill #0

## 2022-11-25 NOTE — Discharge Summary (Signed)
Physician Discharge Summary  Alfred Gray ZOX:[id number] DOB: [birth year] DOA: 11/20/2022  PCP: Pcp, No  Admit date: 11/20/2022 Discharge date: 11/25/2022  Time spent: 60 minutes  Recommendations for Outpatient Follow-up:  Follow-up with PCP in 2 weeks.  On follow-up patient will need a comprehensive metabolic profile done to follow-up on electrolytes, renal function, LFTs.  Patient will need a CBC done to follow-up on counts.  Preauthorization for Xarelto need to be followed up upon per PCP.  Hypercoagulable panel need to be followed up upon.  Hypertension will need to be followed up upon. Follow-up with Dr. Glenna Fellows, nephrology in 2 months.   Discharge Diagnoses:  Principal Problem:   Renal infarct Active Problems:   Tobacco abuse   HTN (hypertension)   Hyperlipidemia   Transaminitis   AKI (acute kidney injury)   Leukocytosis   Discharge Condition: Stable and improved.  Diet recommendation: Heart healthy  Filed Weights   11/23/22 0616 11/24/22 0324 11/25/22 0324  Weight: 68.8 kg 69.4 kg 68.2 kg    History of present illness:   Alfred Gray is a 57 y.o. male with medical history significant of ongoing tobacco use, hypertension, hyperlipidemia presented to the ED with a 2 to 3-day history of left flank pain and nausea.  Patient noted to have had a bout of emesis on the day of admission which was nonbloody.  Patient denies any fevers, no chills, no chest pain, no shortness of breath, no syncope, no lightheadedness, no abdominal pain, no diarrhea, no constipation, no melena, no hematemesis, no hematochezia.  No lightheadedness.  No dizziness.  Denies any dysuria.  Denies any significant decrease in urine output.   ED Course: Patient seen in the ED, noted to have systolic blood pressures in the 170s, afebrile, sats of 100% on room air.  Basic metabolic profile with a glucose of 125, creatinine of 1.43 otherwise within normal limits.  Hepatic panel with a direct bilirubin of 0.5,  total bilirubin of 1.4 otherwise within normal limits.  CBC with hemoglobin of 17.1 otherwise within normal limits.  Urinalysis done hazy, small hemoglobin, 5 ketones, nitrite negative, leukocytes negative, rare bacteria, 0-5 WBCs.  CT abdomen and pelvis done 11/20/2022 with multiple patchy foci of decreased PERFUSION IN THE LEFT KIDNEY.  MAY SUGGEST ACUTE PYELONEPHRITIS.  ANOTHER POSSIBILITY WOULD BE MULTIPLE EMBOLI IN THE PERIPHERAL LEFT RENAL ARTERY BRANCHES FROM CENTRAL CARDIAC SOURCE.  ECHO MAY BE CONSIDERED.  NO EVIDENCE OF INTESTINAL OBSTRUCTION OR PNEUMOPERITONEUM.  APPENDIX IS NOT DILATED.  NO HYDRONEPHROSIS.  MILD DIFFUSE MUCOSAL ENHANCEMENT IN THE STOMACH WHICH MAY BE AN ARTIFACT CAUSED BY INCOMPLETE DISTENTION ALTHOUGH SUGGESTS GASTRITIS.  MULTIPLE SMALL LOW-DENSITY FOCI IN THE LIVER POSSIBLY CYSTS OR HEMANGIOMAS.  DIVERTICULOSIS OF THE COLON WITHOUT SIGNS OF FOCAL ACUTE DIVERTICULITIS. 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  Aortic atherosclerosis without dissection or aneurysm.  Colonic diverticulosis. 2D echo done with a EF of 55 to 60%,NWMA, grade 1 diastolic dysfunction.  No significant emboli noted on 2D echo.  Hospitalist called to admit the patient for further evaluation and management.    Hospital Course:  #1 left renal infarct -Patient presented with left CVA tenderness, nausea. -Urinalysis negative for any acute infection. 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  Wide patency of  proximal left renal artery.  Aortic atherosclerosis without dissection or aneurysm noted. -??  Etiology.  Patient with no history of A-fib. -CT angiogram chest done negative for pulmonary emboli. -2D echo done with normal EF, NWMA, grade 1 diastolic  dysfunction, no emboli noted. -Continue telemetry to monitor for A-fib.   -Hypercoagulable panel ordered and pending with a homocystine level of 13.7, cardiolipin antibody IgG IgM negative, IgM at 14.  Antithrombin activity of 101.  Beta-2 glycoprotein negative. -Prothrombin gene mutation pending.  Factor V Leiden pending.  No lupus anticoagulant was detected. Protein S within normal limits, protein C activity within normal limits, total protein C pending. -Patient seen in consultation by vascular surgery who feel no surgical intervention at this time and recommended possibility of a TEE for further evaluation and continuation of anticoagulation. -Patient also seen in consultation by nephrology. -Patient maintained on IV heparin throughout most of the hospitalization. -Statin initially started but subsequently discontinued due to transaminitis. -Patient remained afebrile throughout the hospitalization, initially had a worsening of leukocytosis which trended back down and resolved by day of discharge.   -Blood cultures ordered with no growth to date x 3 days.   -Patient underwent TEE 11/24/2022 with normal valve function, no obvious vegetations noted, no masses seen, LAA without masses, no PFO by color Doppler was tested with injection of agitated saline.  Normal left ventricular ejection fraction and right ventricular ejection fraction. -Patient subsequently transition to Xarelto which she will be discharged on. -Preauthorization for Xarelto need to be followed up upon in the outpatient setting per PCP. -Outpatient follow-up with PCP and nephrology.   2.  Acute kidney injury -Creatinine was slowly trending up seem to have plateaued and slowly trending back down however seem to have stabilized at 1.42 by day of discharge.  - Last creatinine noted on epic was 0.86 on 05/15/2012. -Patient noted to have good urine output during the hospitalization.  -Acute kidney injury likely secondary to renal infarct  in the setting of possible dehydration. -Urinalysis nitrite negative leukocytes negative small hemoglobin, 100 protein, 0-5 WBCs. -Patient noted to have received IV contrast on admission which may be contributing to rising creatinine. -Patient hydrated IV fluids.   -Nephrotoxins avoided.   -Nephrology was following but have signed off as of 11/23/2022. -Outpatient follow-up with nephrology.   3.  Hypertension -Patient stated has not taken his blood pressure medications in about 2 months.  States has not seen his PCP in over a year however does endorse that he has his antihypertensive medications at home however cannot remember the name. -Patient started on Norvasc 10 mg daily during the hospitalization with good blood pressure control.   -Patient will be discharged on Norvasc 10 mg daily with outpatient follow-up with PCP.    4.  History of hyperlipidemia -Fasting lipid panel with LDL of 101, total cholesterol of 199.   -Patient was started on Lipitor however due to transaminitis, Lipitor was held and will be resumed on discharge.     5.  Transaminitis -Patient noted with a transaminitis which was normal on admission. -LFTs trended down and had resolved by day of discharge.. -CT abdomen and pelvis concerning for cyst versus hemangiomas in the liver. -Acute hepatitis panel is negative. -Outpatient follow-up with PCP.   6.  Tobacco abuse -Tobacco cessation stressed to patient.   -Patient declined nicotine patch during the hospitalization.   7.  Leukocytosis -??  Etiology.  Could likely be reactive secondary to renal infarcts. -Urinalysis done nitrite negative leukocytes -  0-5 WBCs.  Patient with no urinary symptoms. -Chest x-ray unremarkable except for COPD. -Patient afebrile. -Due to presentation of renal infarcts, no emboli noted on 2D echo, CT angiogram chest negative for PE, with initially a rising leukocytosis with concern for possible endocarditis.   -Leukocytosis trended down and  had resolved by day of discharge. -2D echo unremarkable. -Blood cultures were obtained which were negative x 3 days. -Patient underwent TEE 11/24/2022 with normal valve function, no obvious vegetations noted, no masses seen, LAA without masses, no PFO by color Doppler was tested with injection of agitated saline.  Normal left ventricular ejection fraction and right ventricular ejection fraction. -Patient improved clinically and remained in stable condition. -Outpatient follow-up with PCP.      Procedures: CT angiogram chest abdomen and pelvis 11/20/2022 CT abdomen and pelvis 11/20/2022 Chest x-ray 11/21/2022 2D echo 11/20/2022 TEE 11/24/2022  Consultations: Vascular surgery: Dr. Lenell Antu 11/20/2022 Nephrology: Dr. Glenna Fellows 11/21/2022  Discharge Exam: Vitals:   11/25/22 1132 11/25/22 1522  BP: 125/74 127/79  Pulse: 62 64  Resp: 20 17  Temp: 98.6 F (37 C) 98.5 F (36.9 C)  SpO2:      General: NAD Cardiovascular: Regular rate rhythm no murmurs rubs or gallops.  No JVD.  No lower extremity edema. Respiratory: Clear to auscultation bilaterally.  No wheezes, no crackles, no rhonchi.  Fair air movement.  Speaking in full sentences.  Discharge Instructions   Discharge Instructions     Diet - low sodium heart healthy   Complete by: As directed    Increase activity slowly   Complete by: As directed       Allergies as of 11/25/2022   No Known Allergies      Medication List     STOP taking these medications    DSS 100 MG Caps   oxyCODONE-acetaminophen 5-325 MG tablet Commonly known as: PERCOCET/ROXICET       TAKE these medications    acetaminophen 500 MG tablet Commonly known as: TYLENOL Take 1 tablet (500 mg total) by mouth every 6 (six) hours as needed for mild pain (or Fever >/= 101).   amLODipine 10 MG tablet Commonly known as: NORVASC Take 1 tablet (10 mg total) by mouth daily. Start taking on: November 26, 2022   atorvastatin 40 MG tablet Commonly known as:  Lipitor Take 1 tablet (40 mg total) by mouth daily.   ondansetron 4 MG tablet Commonly known as: ZOFRAN Take 1 tablet (4 mg total) by mouth every 6 (six) hours as needed for nausea.   pantoprazole 40 MG tablet Commonly known as: PROTONIX Take 1 tablet (40 mg total) by mouth daily. Start taking on: November 26, 2022   Rivaroxaban Stater Pack (15 mg and 20 mg) Commonly known as: XARELTO STARTER PACK Follow package directions: Take one  tablet by mouth twice a day. On day 22, switch to one  tablet once a day. Take with food.       No Known Allergies  Follow-up Information     PCP. Schedule an appointment as soon as possible for a visit in 2 week(s).          Alfred Pita, MD. Schedule an appointment as soon as possible for a visit in 2 month(s).   Specialty: Internal Medicine Contact information: [address] [place] [address] [phone]                  The results of significant diagnostics from this hospitalization (including imaging, microbiology, ancillary and  laboratory) are listed below for reference.    Significant Diagnostic Studies: ECHO TEE  Result Date: 11/24/2022    TRANSESOPHOGEAL ECHO REPORT   Patient Name:   Alfred Gray Date of Exam: 11/24/2022 Medical Rec #:  [id number]        Height:       71.0 in Accession #:    [id number]       Weight:       153.1 lb Date of Birth:  1966/03/25         BSA:          1.882 m Patient Age:    57 years         BP:           140/94 mmHg Patient Gender: M                HR:           84 bpm. Exam Location:  Inpatient Procedure: Transesophageal Echo, 3D Echo, Color Doppler, Cardiac Doppler and            Saline Contrast Bubble Study Indications:     Renal Infarct  History:         Patient has prior history of Echocardiogram examinations, most                  recent 11/20/2022. Risk Factors:Dyslipidemia, Current Smoker and                  Hypertension.  Sonographer:     Eulah Pont RDCS Referring Phys:  [id number]  PAULA V ROSS Diagnosing Phys: Alfred Pates MD PROCEDURE: After discussion of the risks and benefits of a TEE, an informed consent was obtained from the patient. The transesophogeal probe was passed without difficulty through the esophogus of the patient. Sedation performed by different physician. The patient was monitored while under deep sedation. Anesthestetic sedation was provided intravenously by Anesthesiology: 218.61mg  of Propofol. The patient's vital signs; including heart rate, blood pressure, and oxygen saturation; remained stable throughout the procedure. The patient developed no complications during the procedure.  IMPRESSIONS  1. LVEF is normal . Indeterminate diastolic filling due to E-A fusion.  2. Right ventricular systolic function is normal. The right ventricular size is normal.  3. No left atrial/left atrial appendage thrombus was detected.  4. No PFO s tested by color doppler or with injection of agitated saline intravenously.  5. The mitral valve is normal in structure. Trivial mitral valve regurgitation.  6. The aortic valve is normal in structure. Aortic valve regurgitation is not visualized.  7. Minimal fixed plaqug in thoracic aorta. FINDINGS  Left Ventricle: LVEF is normal. The left ventricular internal cavity size was normal in size. Indeterminate diastolic filling due to E-A fusion. Right Ventricle: The right ventricular size is normal. Right vetricular wall thickness was not assessed. Right ventricular systolic function is normal. Left Atrium: Left atrial size was normal in size. No left atrial/left atrial appendage thrombus was detected. Right Atrium: Right atrial size was normal in size. Pericardium: There is no evidence of pericardial effusion. Mitral Valve: The mitral valve is normal in structure. Trivial mitral valve regurgitation. Tricuspid Valve: The tricuspid valve is normal in structure. Tricuspid valve regurgitation is trivial. Aortic Valve: The aortic valve is normal in  structure. Aortic valve regurgitation is not visualized. Pulmonic Valve: The pulmonic valve was normal in structure. Pulmonic valve regurgitation is trivial. Aorta: Minimal fixed plaqug in thoracic aorta. The aortic root is normal  in size and structure. IAS/Shunts: No atrial level shunt detected by color flow Doppler. Agitated saline contrast was given intravenously to evaluate for intracardiac shunting. There is no evidence of a patent foramen ovale. No PFO s tested by color doppler or with injection of agitated saline intravenously. Additional Comments: Spectral Doppler performed. Alfred Pates MD Electronically signed by Alfred Pates MD Signature Date/Time: 11/24/2022/7:32:36 PM    Final    EP STUDY  Result Date: 11/24/2022 See surgical note for result.  DG Chest 2 View  Result Date: 11/21/2022 CLINICAL DATA:  Leukocytosis. EXAM: CHEST - 2 VIEW COMPARISON:  None Available. FINDINGS: The heart size is normal. Lungs are clear. Changes of COPD are noted. No focal airspace disease is present. No edema or effusion is present. Degenerative changes are present at the shoulders bilaterally, left greater than right. The visualized soft tissues and bony thorax are otherwise unremarkable. IMPRESSION: 1. No acute cardiopulmonary disease. 2. COPD. Electronically Signed   By: Marin Roberts M.D.   On: 11/21/2022 09:53   ECHOCARDIOGRAM COMPLETE  Result Date: 11/20/2022    ECHOCARDIOGRAM REPORT   Patient Name:   Alfred Gray Date of Exam: 11/20/2022 Medical Rec #:  [id number]        Height:       71.0 in Accession #:    [id number]       Weight:       186.9 lb Date of Birth:  1965/11/23         BSA:          2.049 m Patient Age:    57 years         BP:           140/98 mmHg Patient Gender: M                HR:           61 bpm. Exam Location:  Inpatient Procedure: 2D Echo, Cardiac Doppler and Color Doppler Indications:    Chest pain  History:        Patient has no prior history of Echocardiogram examinations.                  Signs/Symptoms:Chest Pain; Risk Factors:Current Smoker,                 Dyslipidemia and Hypertension.  Sonographer:    Dondra Prader RVT RCS Referring Phys: [id number] [facility] A SMOOT IMPRESSIONS  1. Left ventricular ejection fraction, by estimation, is 55 to 60%. The left ventricle has normal function. The left ventricle has no regional wall motion abnormalities. There is mild concentric left ventricular hypertrophy. Left ventricular diastolic parameters are consistent with Grade I diastolic dysfunction (impaired relaxation).  2. Right ventricular systolic function is normal. The right ventricular size is normal. Tricuspid regurgitation signal is inadequate for assessing PA pressure.  3. The mitral valve is normal in structure. No evidence of mitral valve regurgitation. No evidence of mitral stenosis.  4. The aortic valve is tricuspid. There is mild calcification of the aortic valve. Aortic valve regurgitation is not visualized. Aortic valve sclerosis/calcification is present, without any evidence of aortic stenosis. Aortic valve area, by VTI measures  2.05 cm. Aortic valve mean gradient measures 5.0 mmHg. Aortic valve Vmax measures 1.34 m/s.  5. The inferior vena cava is normal in size with greater than 50% respiratory variability, suggesting right atrial pressure of 3 mmHg. FINDINGS  Left Ventricle: Left ventricular ejection fraction, by estimation, is 55 to  60%. The left ventricle has normal function. The left ventricle has no regional wall motion abnormalities. The left ventricular internal cavity size was normal in size. There is  mild concentric left ventricular hypertrophy. Left ventricular diastolic parameters are consistent with Grade I diastolic dysfunction (impaired relaxation). Normal left ventricular filling pressure. Right Ventricle: The right ventricular size is normal. No increase in right ventricular wall thickness. Right ventricular systolic function is normal. Tricuspid regurgitation signal is  inadequate for assessing PA pressure. Left Atrium: Left atrial size was normal in size. Right Atrium: Right atrial size was normal in size. Pericardium: There is no evidence of pericardial effusion. Mitral Valve: The mitral valve is normal in structure. No evidence of mitral valve regurgitation. No evidence of mitral valve stenosis. Tricuspid Valve: The tricuspid valve is normal in structure. Tricuspid valve regurgitation is not demonstrated. No evidence of tricuspid stenosis. Aortic Valve: The aortic valve is tricuspid. There is mild calcification of the aortic valve. Aortic valve regurgitation is not visualized. Aortic valve sclerosis/calcification is present, without any evidence of aortic stenosis. Aortic valve mean gradient measures 5.0 mmHg. Aortic valve peak gradient measures 7.2 mmHg. Aortic valve area, by VTI measures 2.05 cm. Pulmonic Valve: The pulmonic valve was normal in structure. Pulmonic valve regurgitation is trivial. No evidence of pulmonic stenosis. Aorta: The aortic root is normal in size and structure. Venous: The inferior vena cava is normal in size with greater than 50% respiratory variability, suggesting right atrial pressure of 3 mmHg. IAS/Shunts: No atrial level shunt detected by color flow Doppler.  LEFT VENTRICLE PLAX 2D LVIDd:         5.20 cm      Diastology LVIDs:         4.00 cm      LV e' medial:    3.64 cm/s LV PW:         1.30 cm      LV E/e' medial:  9.9 LV IVS:        1.30 cm      LV e' lateral:   5.43 cm/s LVOT diam:     2.00 cm      LV E/e' lateral: 6.6 LV SV:         61 LV SV Index:   30 LVOT Area:     3.14 cm  LV Volumes (MOD) LV vol d, MOD A2C: 193.0 ml LV vol d, MOD A4C: 199.0 ml LV vol s, MOD A2C: 72.2 ml LV vol s, MOD A4C: 75.8 ml LV SV MOD A2C:     120.8 ml LV SV MOD A4C:     199.0 ml LV SV MOD BP:      122.7 ml RIGHT VENTRICLE             IVC RV Basal diam:  3.60 cm     IVC diam: 1.60 cm RV S prime:     12.90 cm/s TAPSE (M-mode): 2.4 cm LEFT ATRIUM             Index         RIGHT ATRIUM           Index LA diam:        3.80 cm 1.85 cm/m   RA Area:     15.50 cm LA Vol (A2C):   69.2 ml 33.77 ml/m  RA Volume:   40.50 ml  19.77 ml/m LA Vol (A4C):   47.5 ml 23.18 ml/m LA Biplane Vol: 60.7 ml 29.62 ml/m  AORTIC VALVE  PULMONIC VALVE AV Area (Vmax):    2.07 cm      PV Vmax:          0.87 m/s AV Area (Vmean):   1.88 cm      PV Peak grad:     3.1 mmHg AV Area (VTI):     2.05 cm      PR End Diast Vel: 1.43 msec AV Vmax:           134.00 cm/s AV Vmean:          105.000 cm/s AV VTI:            0.298 m AV Peak Grad:      7.2 mmHg AV Mean Grad:      5.0 mmHg LVOT Vmax:         88.30 cm/s LVOT Vmean:        62.800 cm/s LVOT VTI:          0.194 m LVOT/AV VTI ratio: 0.65  AORTA Ao Root diam: 3.40 cm Ao Asc diam:  3.30 cm Ao Arch diam: 2.7 cm MITRAL VALVE MV Area (PHT): 2.94 cm    SHUNTS MV Decel Time: 258 msec    Systemic VTI:  0.19 m MV E velocity: 36.00 cm/s  Systemic Diam: 2.00 cm MV A velocity: 74.40 cm/s MV E/A ratio:  0.48 Armanda Magic MD Electronically signed by Armanda Magic MD Signature Date/Time: 11/20/2022/4:40:03 PM    Final    CT Angio Chest/Abd/Pel for Dissection W and/or Wo Contrast  Result Date: 11/20/2022 CLINICAL DATA:  Arterial embolism suspected, non-extremity, determine source. EXAM: CT ANGIOGRAPHY CHEST, ABDOMEN AND PELVIS TECHNIQUE: Non-contrast CT of the chest was initially obtained. Multidetector CT imaging through the chest, abdomen and pelvis was performed using the standard protocol during bolus administration of intravenous contrast. Multiplanar reconstructed images and MIPs were obtained and reviewed to evaluate the vascular anatomy. RADIATION DOSE REDUCTION: This exam was performed according to the departmental dose-optimization program which includes automated exposure control, adjustment of the mA and/or kV according to patient size and/or use of iterative reconstruction technique. CONTRAST:  OMNIPAQUE IOHEXOL 350 MG/ML SOLN  COMPARISON:  CT abdomen and pelvis 11/20/2022 FINDINGS: CTA CHEST FINDINGS Cardiovascular: There is no evidence of a thoracic aortic intramural hematoma on noncontrast images. There is mild thoracic aortic atherosclerosis without evidence of a dissection or aneurysm. There is a common origin of the brachiocephalic and left common carotid arteries, a normal variant. No pulmonary arterial emboli are identified on this nondedicated study. The heart is borderline enlarged. There is no pericardial effusion. Mediastinum/Nodes: No enlarged axillary, mediastinal, or hilar lymph nodes. Unremarkable thyroid and esophagus. Lungs/Pleura: No pleural effusion or pneumothorax. No consolidation or suspicious nodule. Suspected small focus of mucous near the right mainstem bronchus orifice. Musculoskeletal: No acute osseous abnormality or suspicious osseous lesion. Review of the MIP images confirms the above findings. CTA ABDOMEN AND PELVIS FINDINGS VASCULAR Aorta: Mild soft and calcified plaque, predominantly in the infrarenal aorta. No dissection, aneurysm, or significant stenosis. Celiac: Patent without evidence of aneurysm, dissection, vasculitis or significant stenosis. SMA: Patent without evidence of aneurysm, dissection, vasculitis or significant stenosis. Renals: Both renal arteries are patent proximally without evidence of aneurysm, dissection, or fibromuscular dysplasia. There is occlusion of multiple small, distal left renal artery branch vessels. IMA: Patent without evidence of aneurysm, dissection, vasculitis or significant stenosis. Inflow: Moderate atherosclerosis without evidence of a significant stenosis, dissection, or aneurysm. Veins: No obvious venous abnormality within the limitations of  this arterial phase study. Review of the MIP images confirms the above findings. NON-VASCULAR Hepatobiliary: Multiple small hypodense liver lesions with the largest measuring 1.5 cm and likely reflective of a cyst and with the  majority of the other lesions being subcentimeter in size and too small to fully characterize. Unremarkable gallbladder. No biliary dilatation. Pancreas: Unremarkable. Spleen: Unremarkable. Adrenals/Urinary Tract: Unremarkable adrenal glands. Small focus of cortical scarring laterally in the mid inferior right kidney. Multiple large wedge-shaped regions of absent enhancement in the left kidney, similar to today's earlier CT. No hydronephrosis. Excreted contrast from today's earlier CT in the right renal collecting system, right ureter, and bladder. Stomach/Bowel: The stomach is grossly unremarkable. There is extensive left-sided colonic diverticulosis without evidence of acute diverticulitis. There is no evidence of bowel obstruction. The appendix is unremarkable. Lymphatic: No enlarged lymph nodes in the abdomen or pelvis. Reproductive: Unremarkable prostate. Other: No ascites or pneumoperitoneum. Musculoskeletal: No acute osseous abnormality or suspicious osseous lesion. Review of the MIP images confirms the above findings. IMPRESSION: 1. 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 Wide patency of the proximal left renal artery. 2. Aortic atherosclerosis without dissection or aneurysm. 3. Colonic diverticulosis. Aortic Atherosclerosis (ICD10-I70.0). Electronically Signed   By: Sebastian Ache M.D.   On: 11/20/2022 15:58   CT ABDOMEN PELVIS W CONTRAST  Result Date: 11/20/2022 CLINICAL DATA:  Abdominal pain EXAM: CT ABDOMEN AND PELVIS WITH CONTRAST TECHNIQUE: Multidetector CT imaging of the abdomen and pelvis was performed using the standard protocol following bolus administration of intravenous contrast. RADIATION DOSE REDUCTION: This exam was performed according to the departmental dose-optimization program which includes automated exposure control, adjustment of the mA and/or kV according to patient size and/or use of iterative reconstruction technique. CONTRAST:  75mL  OMNIPAQUE IOHEXOL 350 MG/ML SOLN COMPARISON:  None Available. FINDINGS: Lower chest: No focal infiltrates are seen in lower lung fields. Hepatobiliary: There are a few low-density foci in liver lesion measuring less than 1.5 cm. There is no dilation of bile ducts. Gallbladder is not distended. Pancreas: No focal abnormalities are seen. Spleen: Unremarkable. Adrenals/Urinary Tract: Adrenals are unremarkable. There is no hydronephrosis. There are multiple large areas of decreased enhancement in left kidney. There is no significant perinephric stranding. As far as seen, main left renal artery is patent. Left atrium and left ventricle are not included in their entirety for evaluation. There are no renal or ureteral stones. There is focal cortical thinning in the lateral aspect of lower pole of right kidney. Ureters are not dilated. Urinary bladder is unremarkable. Stomach/Bowel: Stomach is not distended. There is mild diffuse mucosal enhancement in stomach. Small bowel loops are not dilated. Appendix is not dilated. There is no significant wall thickening in colon. Scattered diverticula are seen in colon without signs of focal acute diverticulitis. Vascular/Lymphatic: There are scattered atherosclerotic plaques and calcifications in aorta and its major branches. Reproductive: There are coarse calcifications in prostate. Other: There is no ascites or pneumoperitoneum. Umbilical hernia containing fat is seen. Musculoskeletal: No acute findings are seen. Degenerative changes are noted in lumbar spine, more so at the L4-L5 and L5-S1 levels with encroachment of neural foramina. IMPRESSION: There are multiple patchy foci of decreased or absent perfusion in the left kidney. This may suggest acute pyelonephritis. Another possibility would be multiple emboli in peripheral left renal artery branches from central cardiac source. Please correlate with clinical and laboratory findings. If there is clinical suspicion for arterial  emboli arising from the heart,  echocardiogram may be considered. There is no evidence of intestinal obstruction or pneumoperitoneum. Appendix is not dilated. There is no hydronephrosis. There is mild diffuse mucosal enhancement in the stomach which may be an artifact caused by incomplete distention although suggest gastritis. There are multiple small low-density foci in liver, possibly cysts or hemangiomas. Diverticulosis of colon without signs of focal acute diverticulitis. Electronically Signed   By: Ernie Avena M.D.   On: 11/20/2022 13:40    Microbiology: Recent Results (from the past 240 hour(s))  Culture, blood (Routine X 2) w Reflex to ID Panel     Status: None (Preliminary result)   Collection Time: 11/22/22 11:17 AM   Specimen: BLOOD RIGHT HAND  Result Value Ref Range Status   Specimen Description BLOOD RIGHT HAND  Final   Special Requests   Final    BOTTLES DRAWN AEROBIC ONLY Blood Culture adequate volume   Culture   Final    NO GROWTH 3 DAYS Performed at [facility], [address], [place], [place] [address]    Report Status PENDING  Incomplete  Culture, blood (Routine X 2) w Reflex to ID Panel     Status: None (Preliminary result)   Collection Time: 11/22/22 11:18 AM   Specimen: BLOOD LEFT HAND  Result Value Ref Range Status   Specimen Description BLOOD LEFT HAND  Final   Special Requests   Final    BOTTLES DRAWN AEROBIC AND ANAEROBIC Blood Culture adequate volume   Culture   Final    NO GROWTH 3 DAYS Performed at [facility], [address], [place], [place] [address]    Report Status PENDING  Incomplete     Labs: Basic Metabolic Panel: Recent Labs  Lab 11/21/22 0205 11/22/22 0106 11/23/22 0155 11/24/22 0202 11/25/22 0125  NA 135 133* 136 135 137  K 4.4 3.7 3.8 3.6 4.2  CL 101 104 106 108 106  CO2 21* 21* 23 21* 25  GLUCOSE 113* 115* 105* 112* 95  BUN 12 17 15 13 12   CREATININE 1.58* 1.59* 1.48* 1.36* 1.42*  CALCIUM 9.4 8.4* 8.9 8.5*  8.9  MG 2.0 1.9 2.0  --   --    Liver Function Tests: Recent Labs  Lab 11/21/22 0205 11/22/22 0106 11/23/22 0155 11/24/22 0202 11/25/22 0125  AST 159* 78* 42* 30 24  ALT 94* 81* 66* 51* 44  ALKPHOS 57 46 46 46 44  BILITOT 1.7* 1.0 0.9 0.9 1.1  PROT 7.1 5.6* 6.6 6.1* 6.3*  ALBUMIN 3.7 2.7* 2.9* 2.8* 2.9*   Recent Labs  Lab 11/20/22 1314  LIPASE 31   No results for input(s): "AMMONIA" in the last 168 hours. CBC: Recent Labs  Lab 11/21/22 0205 11/22/22 0106 11/23/22 0155 11/24/22 0202 11/25/22 0125  WBC 13.1* 15.5* 10.5 7.4 6.2  NEUTROABS 9.6* 12.1* 7.2 4.5 3.6  HGB 17.8* 13.8 14.1 13.5 13.6  HCT 51.6 40.9 42.4 37.9* 40.2  MCV 91.0 91.5 92.2 89.0 91.0  PLT 230 157 156 169 181   Cardiac Enzymes: No results for input(s): "CKTOTAL", "CKMB", "CKMBINDEX", "TROPONINI" in the last 168 hours. BNP: BNP (last 3 results) No results for input(s): "BNP" in the last 8760 hours.  ProBNP (last 3 results) No results for input(s): "PROBNP" in the last 8760 hours.  CBG: No results for input(s): "GLUCAP" in the last 168 hours.     Signed:  Ramiro Harvest MD.  [facility] 11/25/2022, 5:03 PM

## 2022-11-25 NOTE — Discharge Instructions (Addendum)
Information on my medicine - XARELTO (rivaroxaban)  This medication education was reviewed with me or my healthcare representative as part of my discharge preparation.    WHY WAS XARELTO PRESCRIBED FOR YOU? Xarelto was prescribed to treat blood clots that may have been found in the veins of your legs (deep vein thrombosis) or in your lungs (pulmonary embolism) and to reduce the risk of them occurring again.  What do you need to know about Xarelto? The starting dose is one 15 mg tablet taken TWICE daily with food for the FIRST 21 DAYS then on (enter date)  12/15/22  the dose is changed to one 20 mg tablet taken ONCE A DAY with your evening meal.  DO NOT stop taking Xarelto without talking to the health care provider who prescribed the medication.  Refill your prescription for 20 mg tablets before you run out.  After discharge, you should have regular check-up appointments with your healthcare provider that is prescribing your Xarelto.  In the future your dose may need to be changed if your kidney function changes by a significant amount.  What do you do if you miss a dose? If you are taking Xarelto TWICE DAILY and you miss a dose, take it as soon as you remember. You may take two 15 mg tablets (total 30 mg) at the same time then resume your regularly scheduled 15 mg twice daily the next day.  If you are taking Xarelto ONCE DAILY and you miss a dose, take it as soon as you remember on the same day then continue your regularly scheduled once daily regimen the next day. Do not take two doses of Xarelto at the same time.   Important Safety Information Xarelto is a blood thinner medicine that can cause bleeding. You should call your healthcare provider right away if you experience any of the following: Bleeding from an injury or your nose that does not stop. Unusual colored urine (red or dark brown) or unusual colored stools (red or black). Unusual bruising for unknown reasons. A serious  fall or if you hit your head (even if there is no bleeding).  Some medicines may interact with Xarelto and might increase your risk of bleeding while on Xarelto. To help avoid this, consult your healthcare provider or pharmacist prior to using any new prescription or non-prescription medications, including herbals, vitamins, non-steroidal anti-inflammatory drugs (NSAIDs) and supplements.  This website has more information on Xarelto: [email or web address].br.

## 2022-11-26 ENCOUNTER — Other Ambulatory Visit (HOSPITAL_COMMUNITY): Payer: Self-pay

## 2022-11-26 LAB — CULTURE, BLOOD (ROUTINE X 2)

## 2022-11-26 LAB — PROTEIN C, TOTAL: Protein C, Total: 85 % (ref 60–150)

## 2022-11-27 LAB — CULTURE, BLOOD (ROUTINE X 2): Culture: NO GROWTH

## 2022-12-01 LAB — PROTHROMBIN GENE MUTATION

## 2022-12-04 LAB — FACTOR 5 LEIDEN

## 2022-12-24 ENCOUNTER — Other Ambulatory Visit (HOSPITAL_COMMUNITY): Payer: Self-pay

## 2023-03-23 ENCOUNTER — Inpatient Hospital Stay: Payer: Commercial Managed Care - PPO

## 2023-03-23 ENCOUNTER — Inpatient Hospital Stay: Payer: Commercial Managed Care - PPO | Attending: Hematology and Oncology | Admitting: Hematology and Oncology
